# Patient Record
Sex: Male | Born: 1960 | Race: White | Hispanic: No | Marital: Married | Smoking: Never smoker
Health system: Southern US, Community
[De-identification: ages and names within clinical notes are randomized; demographics above are authoritative.]

## PROBLEM LIST (undated history)

## (undated) DIAGNOSIS — E785 Hyperlipidemia, unspecified: Secondary | ICD-10-CM

## (undated) DIAGNOSIS — J309 Allergic rhinitis, unspecified: Secondary | ICD-10-CM

## (undated) DIAGNOSIS — D72829 Elevated white blood cell count, unspecified: Secondary | ICD-10-CM

## (undated) DIAGNOSIS — N2889 Other specified disorders of kidney and ureter: Secondary | ICD-10-CM

## (undated) DIAGNOSIS — F419 Anxiety disorder, unspecified: Principal | ICD-10-CM

## (undated) DIAGNOSIS — F329 Major depressive disorder, single episode, unspecified: Principal | ICD-10-CM

## (undated) DIAGNOSIS — N182 Chronic kidney disease, stage 2 (mild): Secondary | ICD-10-CM

## (undated) DIAGNOSIS — I10 Essential (primary) hypertension: Secondary | ICD-10-CM

## (undated) HISTORY — DX: Elevated white blood cell count, unspecified: D72.829

## (undated) HISTORY — DX: Other specified disorders of kidney and ureter: N28.89

## (undated) HISTORY — DX: Allergic rhinitis, unspecified: J30.9

## (undated) HISTORY — DX: Hyperlipidemia, unspecified: E78.5

## (undated) HISTORY — DX: Chronic kidney disease, stage 2 (mild): N18.2

## (undated) HISTORY — DX: Essential (primary) hypertension: I10

## (undated) HISTORY — DX: Major depressive disorder, single episode, unspecified: F32.9

## (undated) HISTORY — DX: Anxiety disorder, unspecified: F41.9

## (undated) HISTORY — PX: FINGER SURGERY: SHX640

---
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
## 1999-07-07 ENCOUNTER — Inpatient Hospital Stay (HOSPITAL_COMMUNITY): Admission: AD | Admit: 1999-07-07 | Discharge: 1999-07-09 | Payer: Self-pay | Admitting: *Deleted

## 2001-02-22 ENCOUNTER — Inpatient Hospital Stay (HOSPITAL_COMMUNITY): Admission: EM | Admit: 2001-02-22 | Discharge: 2001-02-24 | Payer: Self-pay | Admitting: Psychiatry

## 2001-02-23 ENCOUNTER — Encounter (HOSPITAL_COMMUNITY): Payer: Self-pay | Admitting: Psychiatry

## 2006-07-25 ENCOUNTER — Ambulatory Visit: Payer: Self-pay | Admitting: Family Medicine

## 2006-07-27 ENCOUNTER — Ambulatory Visit: Payer: Self-pay | Admitting: Family Medicine

## 2006-07-27 LAB — CONVERTED CEMR LAB
ALT: 36 units/L (ref 0–40)
AST: 23 units/L (ref 0–37)
Albumin: 4.1 g/dL (ref 3.5–5.2)
Alkaline Phosphatase: 51 units/L (ref 39–117)
BUN: 18 mg/dL (ref 6–23)
Basophils Absolute: 0.1 10*3/uL (ref 0.0–0.1)
Basophils Relative: 0.7 % (ref 0.0–1.0)
CO2: 29 meq/L (ref 19–32)
Calcium: 9.4 mg/dL (ref 8.4–10.5)
Chloride: 103 meq/L (ref 96–112)
Chol/HDL Ratio, serum: 6.6
Cholesterol: 278 mg/dL (ref 0–200)
Creatinine, Ser: 1.4 mg/dL (ref 0.4–1.5)
Eosinophil percent: 3.3 % (ref 0.0–5.0)
GFR calc non Af Amer: 59 mL/min
Glomerular Filtration Rate, Af Am: 71 mL/min/{1.73_m2}
Glucose, Bld: 90 mg/dL (ref 70–99)
HCT: 44.7 % (ref 39.0–52.0)
HDL: 42.3 mg/dL (ref >39.0)
Hemoglobin: 15.1 g/dL (ref 13.0–17.0)
LDL DIRECT: 134.9 mg/dL
Lymphocytes Relative: 26 % (ref 12.0–46.0)
MCHC: 33.8 g/dL (ref 30.0–36.0)
MCV: 92.2 fL (ref 78.0–100.0)
Monocytes Absolute: 0.9 10*3/uL — ABNORMAL HIGH (ref 0.2–0.7)
Monocytes Relative: 7.9 % (ref 3.0–11.0)
Neutro Abs: 6.6 10*3/uL (ref 1.4–7.7)
Neutrophils Relative %: 62.1 % (ref 43.0–77.0)
PSA: 1.58 ng/mL (ref 0.10–4.00)
Platelets: 320 10*3/uL (ref 150–400)
Potassium: 3.6 meq/L (ref 3.5–5.1)
RBC: 4.85 M/uL (ref 4.22–5.81)
RDW: 11.9 % (ref 11.5–14.6)
Sodium: 140 meq/L (ref 135–145)
TSH: 2.69 microintl units/mL (ref 0.35–5.50)
Total Bilirubin: 1 mg/dL (ref 0.3–1.2)
Total Protein: 6.9 g/dL (ref 6.0–8.3)
Triglyceride fasting, serum: 350 mg/dL (ref 0–149)
VLDL: 70 mg/dL — ABNORMAL HIGH (ref 0–40)
WBC: 10.8 10*3/uL — ABNORMAL HIGH (ref 4.5–10.5)

## 2007-05-17 DIAGNOSIS — K219 Gastro-esophageal reflux disease without esophagitis: Secondary | ICD-10-CM | POA: Insufficient documentation

## 2009-02-19 ENCOUNTER — Encounter: Payer: Self-pay | Admitting: Family Medicine

## 2010-09-27 ENCOUNTER — Emergency Department (INDEPENDENT_AMBULATORY_CARE_PROVIDER_SITE_OTHER): Payer: No Typology Code available for payment source

## 2010-09-27 ENCOUNTER — Emergency Department (HOSPITAL_BASED_OUTPATIENT_CLINIC_OR_DEPARTMENT_OTHER)
Admission: EM | Admit: 2010-09-27 | Discharge: 2010-09-27 | Disposition: A | Discharge status: Home / Self Care | Payer: No Typology Code available for payment source | Attending: Emergency Medicine | Admitting: Emergency Medicine

## 2010-09-27 DIAGNOSIS — R55 Syncope and collapse: Secondary | ICD-10-CM | POA: Insufficient documentation

## 2010-09-27 DIAGNOSIS — F17200 Nicotine dependence, unspecified, uncomplicated: Secondary | ICD-10-CM

## 2010-09-27 DIAGNOSIS — I10 Essential (primary) hypertension: Secondary | ICD-10-CM

## 2010-09-27 LAB — POCT CARDIAC MARKERS
CKMB, poc: 1.1 ng/mL (ref 1.0–8.0)
Myoglobin, poc: 184 ng/mL (ref 12–200)
Troponin i, poc: <0.05 ng/mL (ref 0.00–0.09)

## 2010-09-27 LAB — DIFFERENTIAL
Basophils Absolute: 0.1 10*3/uL (ref 0.0–0.1)
Basophils Relative: 0 % (ref 0–1)
Eosinophils Absolute: 0.2 10*3/uL (ref 0.0–0.7)
Eosinophils Relative: 1 % (ref 0–5)
Lymphocytes Relative: 8 % — ABNORMAL LOW (ref 12–46)
Lymphs Abs: 1.9 10*3/uL (ref 0.7–4.0)
Monocytes Absolute: 1.1 10*3/uL — ABNORMAL HIGH (ref 0.1–1.0)
Monocytes Relative: 4 % (ref 3–12)
Neutro Abs: 20.8 10*3/uL — ABNORMAL HIGH (ref 1.7–7.7)
Neutrophils Relative %: 87 % — ABNORMAL HIGH (ref 43–77)

## 2010-09-27 LAB — CBC
HCT: 39.5 % (ref 39.0–52.0)
Hemoglobin: 14.3 g/dL (ref 13.0–17.0)
MCH: 32.6 pg (ref 26.0–34.0)
MCHC: 36.2 g/dL — ABNORMAL HIGH (ref 30.0–36.0)
MCV: 90.2 fL (ref 78.0–100.0)
Platelets: 308 10*3/uL (ref 150–400)
RBC: 4.38 MIL/uL (ref 4.22–5.81)
RDW: 12.6 % (ref 11.5–15.5)
WBC: 24 10*3/uL — ABNORMAL HIGH (ref 4.0–10.5)

## 2010-09-27 LAB — BASIC METABOLIC PANEL
CO2: 20 mEq/L (ref 19–32)
Calcium: 8.6 mg/dL (ref 8.4–10.5)
Creatinine, Ser: 1.3 mg/dL (ref 0.4–1.5)
GFR calc Af Amer: >60 mL/min (ref >60)
GFR calc non Af Amer: 59 mL/min — ABNORMAL LOW (ref >60)

## 2010-09-27 LAB — BASIC METABOLIC PANEL WITH GFR
BUN: 17 mg/dL (ref 6–23)
Chloride: 108 meq/L (ref 96–112)
Glucose, Bld: 90 mg/dL (ref 70–99)
Potassium: 4.1 meq/L (ref 3.5–5.1)
Sodium: 142 meq/L (ref 135–145)

## 2012-06-16 IMAGING — CT CT HEAD W/O CM
1 series · 16 of 30 positions shown, 20 images · non-contrast
Comparison: None.

CLINICAL DATA: Syncope.  Hypotension.

CT HEAD WITHOUT CONTRAST
TECHNIQUE: Contiguous axial images were obtained from the base of
the skull through the vertex without contrast.

[Series 2: head 4.8 h37s · axial · 0.43mm/px · z∈[-140,-7]mm · 16 of 32 slices shown, 20 images]
[im 2/32  brain]
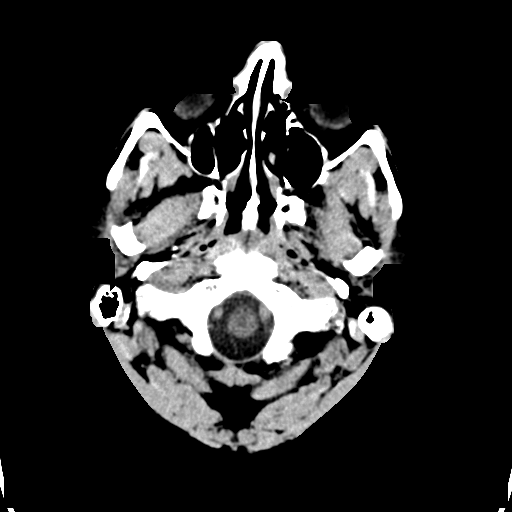
[im 2/32  bone]
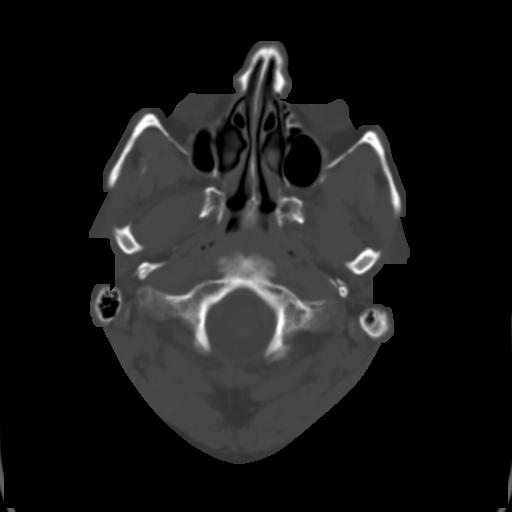
[im 4/32  brain]
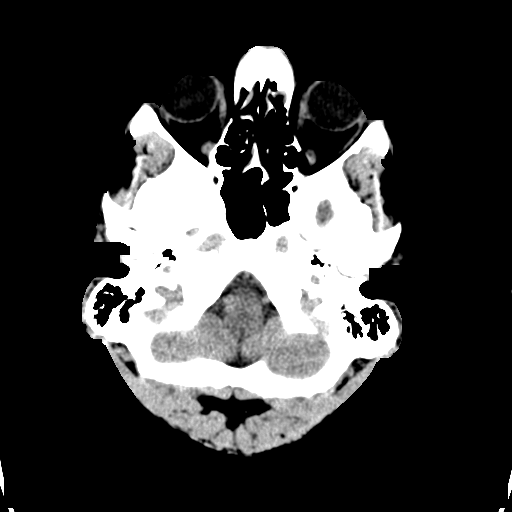
[im 6/32  brain]
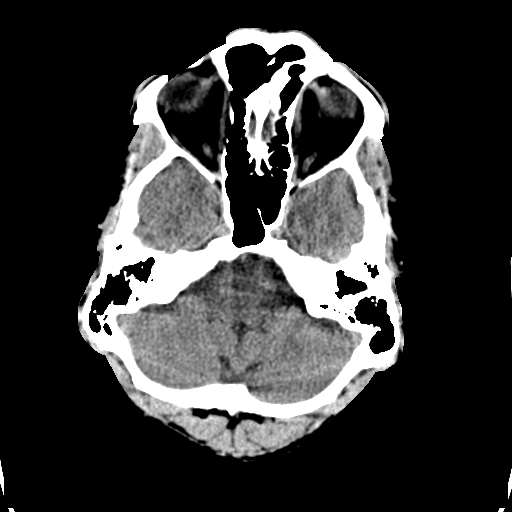
[im 8/32  brain]
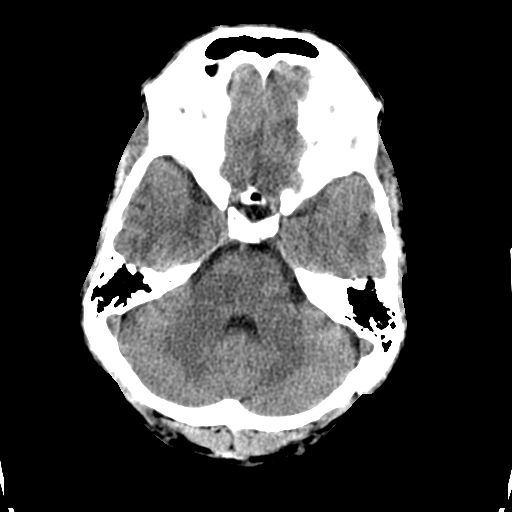
[im 9/32  brain]
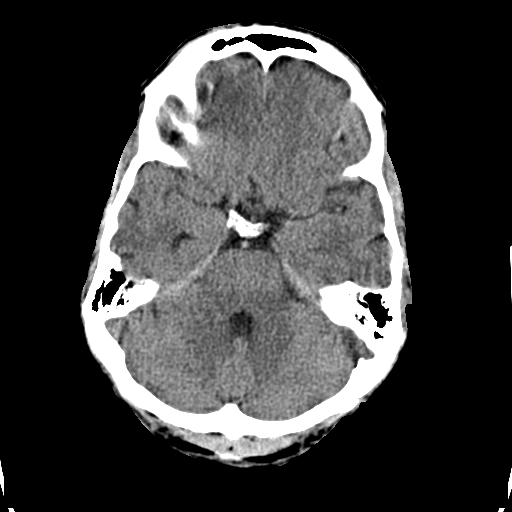
[im 9/32  bone]
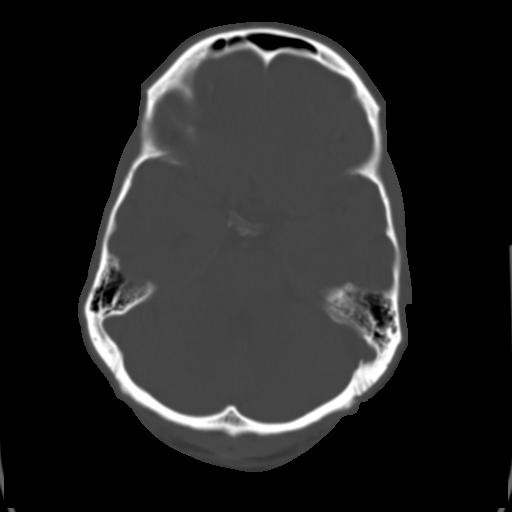
[im 11/32  brain]
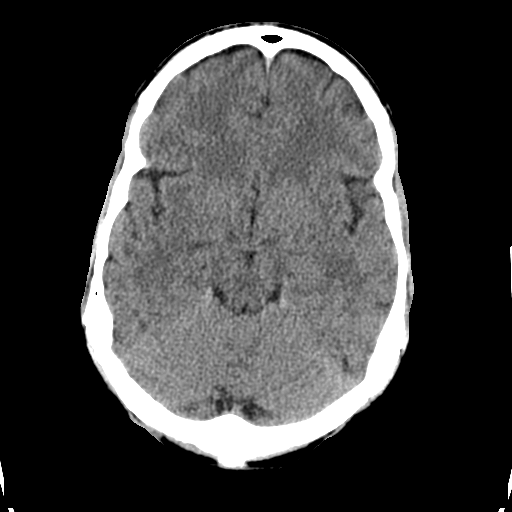
[im 13/32  brain]
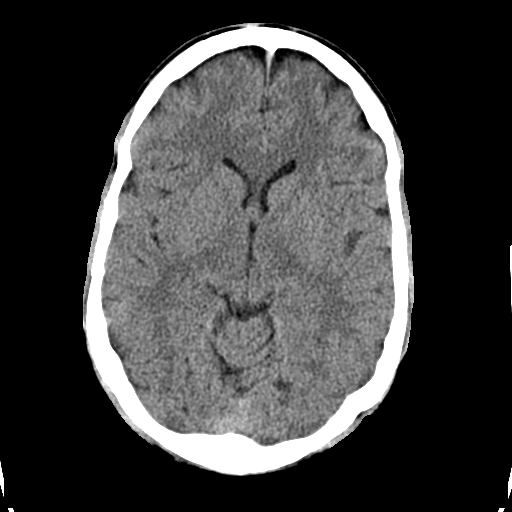
[im 15/32  brain]
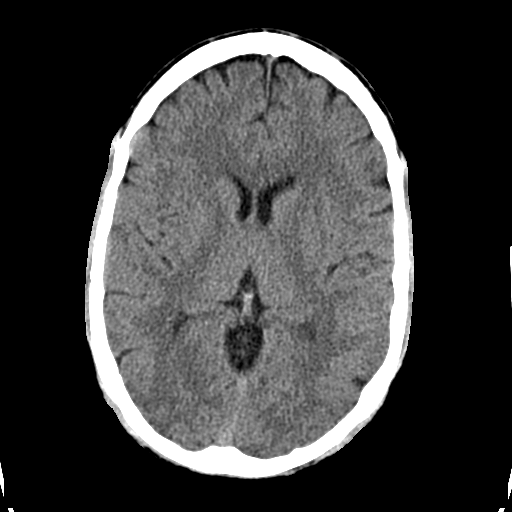
[im 17/32  brain]
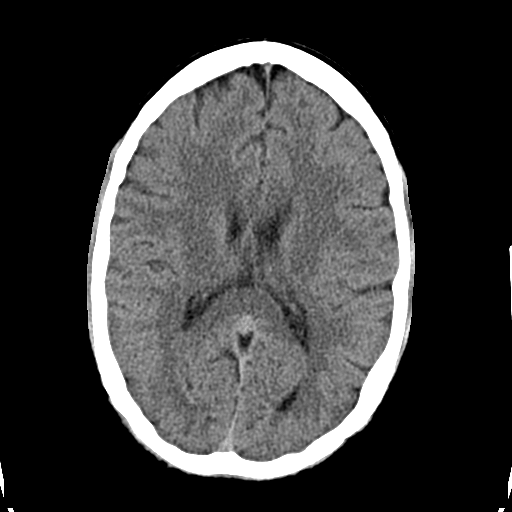
[im 17/32  bone]
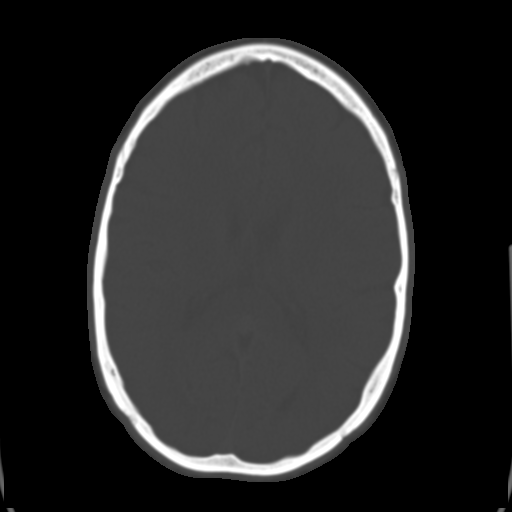
[im 19/32  brain]
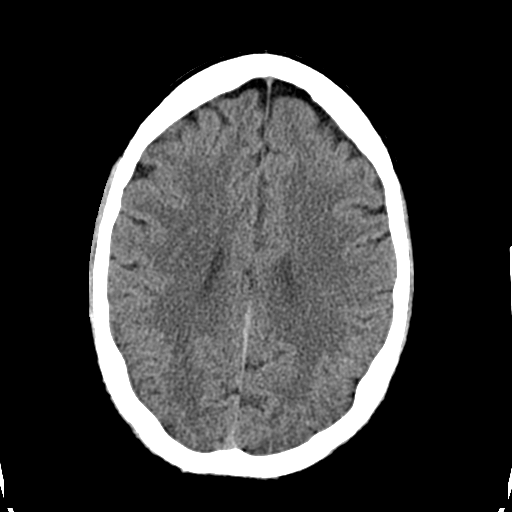
[im 21/32  brain]
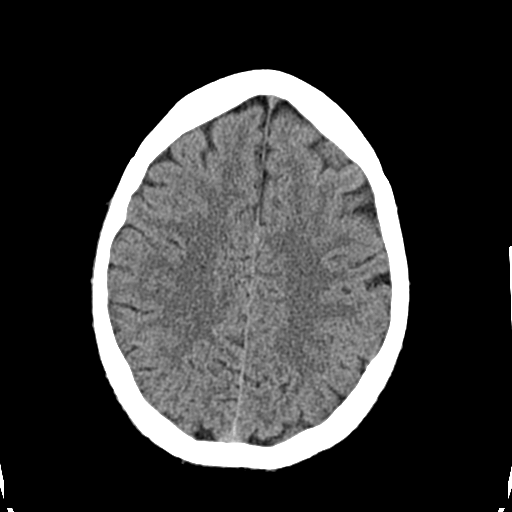
[im 23/32  brain]
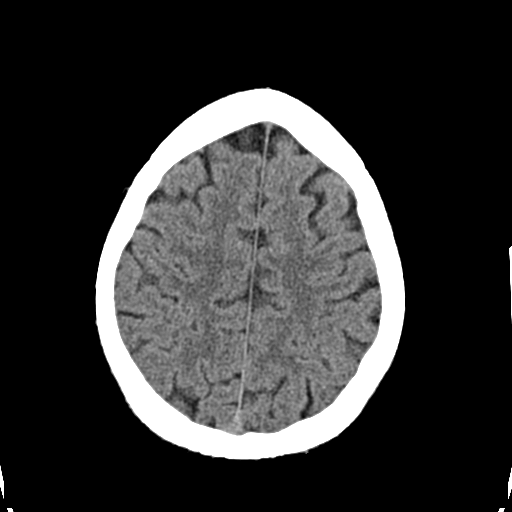
[im 24/32  brain]
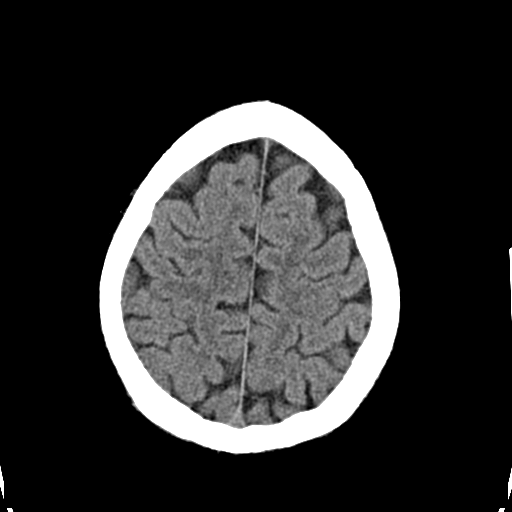
[im 24/32  bone]
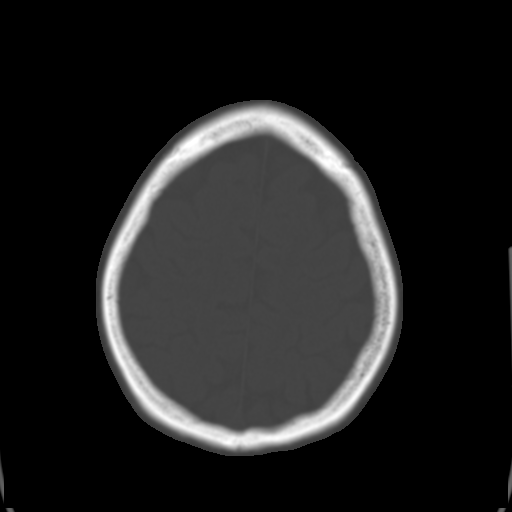
[im 26/32  brain]
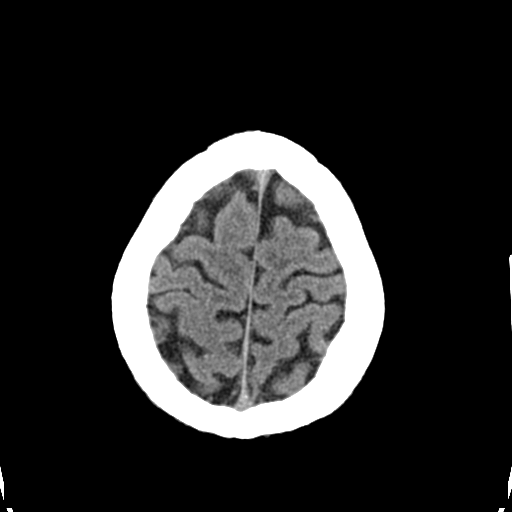
[im 28/32  brain]
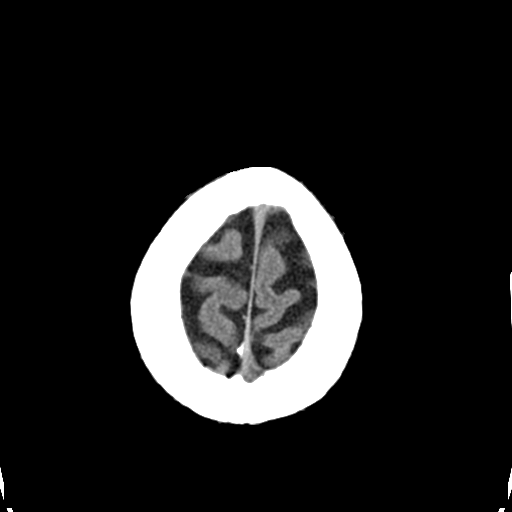
[im 30/32  brain]
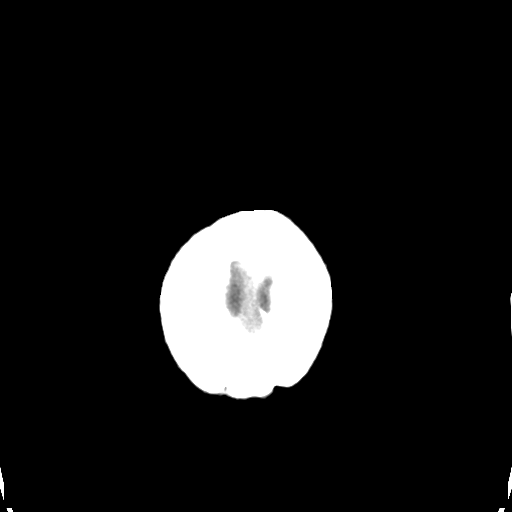

[16 of 30 positions shown; findings below may reference images not displayed]

FINDINGS: The brain stem, cerebellum, cerebral peduncles, thalami,
basal ganglia, basilar cisterns, and ventricular system appear
unremarkable.

No intracranial hemorrhage, mass lesion, or acute infarction is
identified.
IMPRESSION: No significant abnormality identified.

## 2014-04-08 ENCOUNTER — Encounter: Payer: Self-pay | Admitting: Emergency Medicine

## 2014-04-08 ENCOUNTER — Emergency Department
Admission: EM | Admit: 2014-04-08 | Discharge: 2014-04-08 | Disposition: A | Discharge status: Home / Self Care | Payer: Managed Care, Other (non HMO) | Source: Home / Self Care | Attending: Family Medicine | Admitting: Family Medicine

## 2014-04-08 ENCOUNTER — Telehealth: Payer: Self-pay | Admitting: *Deleted

## 2014-04-08 DIAGNOSIS — F411 Generalized anxiety disorder: Secondary | ICD-10-CM

## 2014-04-08 MED ORDER — DULOXETINE HCL 30 MG PO CPEP: 30.0000 mg | ORAL_CAPSULE | Freq: Every day | ORAL | Status: DC

## 2014-04-08 NOTE — ED Notes (Signed)
Patient's wife reports patient has had a lack of appetite and feelings of anxiety for several months.

## 2014-04-08 NOTE — Discharge Instructions (Signed)
Generalized Anxiety Disorder Generalized anxiety disorder (GAD) is a mental disorder. It interferes with life functions, including relationships, work, and school. GAD is different from normal anxiety, which everyone experiences at some point in their lives in response to specific life events and activities. Normal anxiety actually helps us prepare for and get through these life events and activities. Normal anxiety goes away after the event or activity is over.  GAD causes anxiety that is not necessarily related to specific events or activities. It also causes excess anxiety in proportion to specific events or activities. The anxiety associated with GAD is also difficult to control. GAD can vary from mild to severe. People with severe GAD can have intense waves of anxiety with physical symptoms (panic attacks).  SYMPTOMS The anxiety and worry associated with GAD are difficult to control. This anxiety and worry are related to many life events and activities and also occur more days than not for 6 months or longer. People with GAD also have three or more of the following symptoms (one or more in children):  Restlessness.   Fatigue.  Difficulty concentrating.   Irritability.  Muscle tension.  Difficulty sleeping or unsatisfying sleep. DIAGNOSIS GAD is diagnosed through an assessment by your health care provider. Your health care provider will ask you questions aboutyour mood,physical symptoms, and events in your life. Your health care provider may ask you about your medical history and use of alcohol or drugs, including prescription medicines. Your health care provider may also do a physical exam and blood tests. Certain medical conditions and the use of certain substances can cause symptoms similar to those associated with GAD. Your health care provider may refer you to a mental health specialist for further evaluation. TREATMENT The following therapies are usually used to treat GAD:    Medication. Antidepressant medication usually is prescribed for long-term daily control. Antianxiety medicines may be added in severe cases, especially when panic attacks occur.   Talk therapy (psychotherapy). Certain types of talk therapy can be helpful in treating GAD by providing support, education, and guidance. A form of talk therapy called cognitive behavioral therapy can teach you healthy ways to think about and react to daily life events and activities.  Stress managementtechniques. These include yoga, meditation, and exercise and can be very helpful when they are practiced regularly. A mental health specialist can help determine which treatment is best for you. Some people see improvement with one therapy. However, other people require a combination of therapies. Document Released: 12/04/2012 Document Revised: 12/24/2013 Document Reviewed: 12/04/2012 [employer] Patient Information 2015 [employer]. This information is not intended to replace advice given to you by your health care provider. Make sure you discuss any questions you have with your health care provider.  

## 2014-04-08 NOTE — ED Provider Notes (Addendum)
CSN: [id number]     Arrival date & time 04/08/14  1159 History   First MD Initiated Contact with Patient 04/08/14 1236     Chief Complaint  Patient presents with  . Anxiety      HPI Comments: Patient's wife initiates the interview by reporting that he has had a lack of appetite and feelings of anxiety for several months.  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 The patient states that he has always worried excessively, worse over the past year, and significantly increased over the past several months.  His thoughts are always racing, and he has difficulty concentrating on the tasks at hand.  His sleep is poor and he has early morning awakening.  He feels mildly depressed at times, but primarily is anxious and worries constantly.  He is obsessive in his daily activities.  No suicidal ideation.  No history of panic attacks.  He has not been prescribed medication for anxiety or depression in the past. He notes that his BP is normally about 120/70. Family history of similar anxiety in his brother, whose symptoms have been effectively controlled with Cymbalta.  Patient is a 53 y.o. male presenting with anxiety. The history is provided by the patient and the spouse.  Anxiety This is a chronic problem. Episode onset: 1 year ago. The problem occurs constantly. The problem has been gradually worsening. Pertinent negatives include no chest pain, no abdominal pain, no headaches and no shortness of breath. Associated symptoms comments: Decreased appetite for several months. Exacerbated by: job stresses. Nothing relieves the symptoms. He has tried nothing for the symptoms.    History reviewed. No pertinent past medical history. History reviewed. No pertinent past surgical history. History reviewed:  Anxiety disorder in brother. History  Substance Use Topics  . Smoking status: Never Smoker   . Smokeless tobacco: Never Used  . Alcohol Use: 2.4 oz/week    4 Cans of  beer per week    Review of Systems  Respiratory: Negative for shortness of breath.   Cardiovascular: Negative for chest pain.  Gastrointestinal: Negative for abdominal pain.  Neurological: Negative for headaches.  All other systems reviewed and are negative.   Allergies  Review of patient's allergies indicates no known allergies.  Home Medications   Prior to Admission medications   Medication Sig Start Date End Date Taking? Authorizing Provider  DULoxetine (CYMBALTA) 30 MG capsule Take 1 capsule (30 mg total) by mouth daily. 04/08/14   Lattie Haw, MD   BP 189/117  Pulse 93  Temp(Src) 98.6 F (37 C) (Oral)  Resp 18  Ht 5\' 10"  (1.778 m)  Wt 157 lb (71.215 kg)  BMI 22.53 kg/m2  SpO2 9% Physical Exam Nursing notes and Vital Signs reviewed. Appearance:  Patient appears healthy, stated age, and in no acute distress. Psychiatric:  Patient is alert and oriented with poor eye contact initially, improving later in the interview.  Thoughts are organized and appropriate.  Patient quiet initially, with wife responding primarily, but he becomes more animated as the interview progresses.  Affect flat; mood somewhat depressed.  No psychomotor retardation.  Memory intact.  Not suicidal  Eyes:  Pupils are equal, round, and reactive to light and accomodation.  Extraocular movement is intact.  Conjunctivae are not inflamed   Pharynx:  Normal Neck:  Supple.  No adenopathy or thyromegaly Lungs:  Clear to auscultation.  Breath sounds are equal.  Heart:  Regular rate and rhythm without murmurs, rubs,  or gallops.  Abdomen:  Nontender without masses or hepatosplenomegaly.  Bowel sounds are present.  No CVA or flank tenderness.  Extremities:  No edema.  Skin:  No rash present.   ED Course  Procedures  none      MDM   1. Generalized anxiety disorder    Office visit included 30 minutes face to face interview with patient and wife. Begin Cymbalta 30mg , once daily, RX #10, no  refill. Followup with Dr. Ivan AnchorsHommel in one week.    Lattie HawStephen A Beese, MD 04/13/14 1038  Lattie HawStephen A Beese, MD 04/13/14 [phone]

## 2014-04-15 ENCOUNTER — Encounter: Payer: Self-pay | Admitting: Family Medicine

## 2014-04-15 ENCOUNTER — Ambulatory Visit (INDEPENDENT_AMBULATORY_CARE_PROVIDER_SITE_OTHER): Payer: Managed Care, Other (non HMO) | Admitting: Family Medicine

## 2014-04-15 VITALS — BP 153/92 | HR 84 | Ht 70.0 in | Wt 159.0 lb

## 2014-04-15 DIAGNOSIS — F341 Dysthymic disorder: Secondary | ICD-10-CM

## 2014-04-15 DIAGNOSIS — F32A Depression, unspecified: Secondary | ICD-10-CM

## 2014-04-15 DIAGNOSIS — F419 Anxiety disorder, unspecified: Secondary | ICD-10-CM

## 2014-04-15 DIAGNOSIS — F329 Major depressive disorder, single episode, unspecified: Secondary | ICD-10-CM | POA: Insufficient documentation

## 2014-04-15 HISTORY — DX: Depression, unspecified: F32.A

## 2014-04-15 HISTORY — DX: Anxiety disorder, unspecified: F41.9

## 2014-04-15 MED ORDER — DULOXETINE HCL 60 MG PO CPEP: 60.0000 mg | ORAL_CAPSULE | Freq: Every day | ORAL | Status: DC

## 2014-04-15 NOTE — Progress Notes (Signed)
CC: Wayne Douglas is a 53 y.o. male is here for Establish Care   Subjective: HPI:  Very pleasant 53 year old here to establish care  Patient complains of anxiety and depression that has been present since spring of this year on a daily basis that has been worsening over the last month. Symptoms are described as nervousness, fixating on stress in his life, absence of interest in all hobbies (golfing), and difficulty falling asleep. Symptoms are moderate in severity and slightly improved since he started on Cymbalta 2 weeks ago. He denies any known side effects. He still believes there is room for improvement. Symptoms are present all hours of the day, worse when thinking about stress at work, other than the medication above nothing seems to help.  Denies paranoia, hallucinations, nor thoughts wanting to harm himself or others  Review of Systems - General ROS: negative for - chills, fever, night sweats, weight gain or weight loss Ophthalmic ROS: negative for - decreased vision ENT ROS: negative for - hearing change, nasal congestion, tinnitus or allergies Hematological and Lymphatic ROS: negative for - bleeding problems, bruising or swollen lymph nodes Breast ROS: negative Respiratory ROS: no cough, shortness of breath, or wheezing Cardiovascular ROS: no chest pain or dyspnea on exertion Gastrointestinal ROS: no abdominal pain, change in bowel habits, or black or bloody stools Genito-Urinary ROS: negative for - genital discharge, genital ulcers, incontinence or abnormal bleeding from genitals Musculoskeletal ROS: negative for - joint pain or muscle pain Neurological ROS: negative for - headaches or memory loss Dermatological ROS: negative for lumps, mole changes, rash and skin lesion changes   Past Medical History  Diagnosis Date  . Anxiety and depression 04/15/2014    History reviewed. No pertinent past surgical history. Family History  Problem Relation Age of Onset  . Melanoma  Mother   . Heart attack Father     History   Social History  . Marital Status: Single    Spouse Name: N/A    Number of Children: N/A  . Years of Education: N/A   Occupational History  . Not on file.   Social History Main Topics  . Smoking status: Never Smoker   . Smokeless tobacco: Never Used  . Alcohol Use: 2.4 oz/week    4 Cans of beer per week  . Drug Use: No  . Sexual Activity: Yes    Partners: Female   Other Topics Concern  . Not on file   Social History Narrative  . No narrative on file     Objective: BP 153/92  Pulse 84  Ht  (1.778 m)  Wt 159 lb (72.122 kg)  BMI 22.81 kg/m2  General: Alert and Oriented, No Acute Distress HEENT: Pupils equal, round, reactive to light. Conjunctivae clear.  External ears unremarkable, moist mucous membranes pharynx unremarkable Lungs: Clear to auscultation bilaterally, no wheezing/ronchi/rales.  Comfortable work of breathing. Good air movement. Cardiac: Regular rate and rhythm. Normal S1/S2.  No murmurs, rubs, nor gallops.   Extremities: No peripheral edema.  Strong peripheral pulses.  Mental Status: Mild depression and anxiety, nor agitation Skin: Warm and dry.  Assessment & Plan: Wayne Douglas was seen today for establish care.  Diagnoses and associated orders for this visit:  Anxiety and depression - DULoxetine (CYMBALTA) 60 MG capsule; Take 1 capsule (60 mg total) by mouth daily.    Anxiety and depression: Uncontrolled increasing Cymbalta to , followup in 1-2 months.  Return in about 2 months (around 06/15/2014).

## 2014-06-18 ENCOUNTER — Other Ambulatory Visit: Payer: Self-pay | Admitting: *Deleted

## 2014-06-18 DIAGNOSIS — F32A Depression, unspecified: Secondary | ICD-10-CM

## 2014-06-18 DIAGNOSIS — F329 Major depressive disorder, single episode, unspecified: Secondary | ICD-10-CM

## 2014-06-18 DIAGNOSIS — F419 Anxiety disorder, unspecified: Principal | ICD-10-CM

## 2014-06-18 MED ORDER — DULOXETINE HCL 60 MG PO CPEP
60.0000 mg | ORAL_CAPSULE | Freq: Every day | ORAL | Status: DC
Start: 2014-06-18 — End: 2014-07-18

## 2014-07-18 ENCOUNTER — Other Ambulatory Visit: Payer: Self-pay | Admitting: Family Medicine

## 2014-08-22 ENCOUNTER — Other Ambulatory Visit: Payer: Self-pay | Admitting: Family Medicine

## 2015-06-10 ENCOUNTER — Ambulatory Visit (INDEPENDENT_AMBULATORY_CARE_PROVIDER_SITE_OTHER): Payer: BLUE CROSS/BLUE SHIELD | Admitting: Family Medicine

## 2015-06-10 ENCOUNTER — Encounter: Payer: Self-pay | Admitting: Family Medicine

## 2015-06-10 VITALS — BP 168/98 | HR 85 | Wt 158.0 lb

## 2015-06-10 DIAGNOSIS — F418 Other specified anxiety disorders: Secondary | ICD-10-CM

## 2015-06-10 DIAGNOSIS — F32A Depression, unspecified: Secondary | ICD-10-CM

## 2015-06-10 DIAGNOSIS — Z23 Encounter for immunization: Secondary | ICD-10-CM | POA: Diagnosis not present

## 2015-06-10 DIAGNOSIS — F419 Anxiety disorder, unspecified: Secondary | ICD-10-CM

## 2015-06-10 DIAGNOSIS — I10 Essential (primary) hypertension: Secondary | ICD-10-CM | POA: Diagnosis not present

## 2015-06-10 DIAGNOSIS — F329 Major depressive disorder, single episode, unspecified: Secondary | ICD-10-CM

## 2015-06-10 MED ORDER — DULOXETINE HCL 60 MG PO CPEP: 60.0000 mg | ORAL_CAPSULE | Freq: Every day | ORAL | Status: DC

## 2015-06-10 NOTE — Patient Instructions (Signed)
DASH Eating Plan  DASH stands for "Dietary Approaches to Stop Hypertension." The DASH eating plan is a healthy eating plan that has been shown to reduce high blood pressure (hypertension). Additional health benefits may include reducing the risk of type 2 diabetes mellitus, heart disease, and stroke. The DASH eating plan may also help with weight loss.  WHAT DO I NEED TO KNOW ABOUT THE DASH EATING PLAN?  For the DASH eating plan, you will follow these general guidelines:  · Choose foods with a percent daily value for sodium of less than 5% (as listed on the food label).  · Use salt-free seasonings or herbs instead of table salt or sea salt.  · Check with your health care provider or pharmacist before using salt substitutes.  · Eat lower-sodium products, often labeled as "lower sodium" or "no salt added."  · Eat fresh foods.  · Eat more vegetables, fruits, and low-fat dairy products.  · Choose whole grains. Look for the word "whole" as the first word in the ingredient list.  · Choose fish and skinless chicken or turkey more often than red meat. Limit fish, poultry, and meat to 6 oz (170 g) each day.  · Limit sweets, desserts, sugars, and sugary drinks.  · Choose heart-healthy fats.  · Limit cheese to 1 oz (28 g) per day.  · Eat more home-cooked food and less restaurant, buffet, and fast food.  · Limit fried foods.  · Cook foods using methods other than frying.  · Limit canned vegetables. If you do use them, rinse them well to decrease the sodium.  · When eating at a restaurant, ask that your food be prepared with less salt, or no salt if possible.  WHAT FOODS CAN I EAT?  Seek help from a dietitian for individual calorie needs.  Grains  Whole grain or whole wheat bread. Brown rice. Whole grain or whole wheat pasta. Quinoa, bulgur, and whole grain cereals. Low-sodium cereals. Corn or whole wheat flour tortillas. Whole grain cornbread. Whole grain crackers. Low-sodium crackers.  Vegetables  Fresh or frozen vegetables  (raw, steamed, roasted, or grilled). Low-sodium or reduced-sodium tomato and vegetable juices. Low-sodium or reduced-sodium tomato sauce and paste. Low-sodium or reduced-sodium canned vegetables.   Fruits  All fresh, canned (in natural juice), or frozen fruits.  Meat and Other Protein Products  Ground beef (85% or leaner), grass-fed beef, or beef trimmed of fat. Skinless chicken or turkey. Ground chicken or turkey. Pork trimmed of fat. All fish and seafood. Eggs. Dried beans, peas, or lentils. Unsalted nuts and seeds. Unsalted canned beans.  Dairy  Low-fat dairy products, such as skim or 1% milk, 2% or reduced-fat cheeses, low-fat ricotta or cottage cheese, or plain low-fat yogurt. Low-sodium or reduced-sodium cheeses.  Fats and Oils  Tub margarines without trans fats. Light or reduced-fat mayonnaise and salad dressings (reduced sodium). Avocado. Safflower, olive, or canola oils. Natural peanut or almond butter.  Other  Unsalted popcorn and pretzels.  The items listed above may not be a complete list of recommended foods or beverages. Contact your dietitian for more options.  WHAT FOODS ARE NOT RECOMMENDED?  Grains  White bread. White pasta. White rice. Refined cornbread. Bagels and croissants. Crackers that contain trans fat.  Vegetables  Creamed or fried vegetables. Vegetables in a cheese sauce. Regular canned vegetables. Regular canned tomato sauce and paste. Regular tomato and vegetable juices.  Fruits  Dried fruits. Canned fruit in light or heavy syrup. Fruit juice.  Meat and Other Protein   Products  Fatty cuts of meat. Ribs, chicken wings, bacon, sausage, bologna, salami, chitterlings, fatback, hot dogs, bratwurst, and packaged luncheon meats. Salted nuts and seeds. Canned beans with salt.  Dairy  Whole or 2% milk, cream, half-and-half, and cream cheese. Whole-fat or sweetened yogurt. Full-fat cheeses or blue cheese. Nondairy creamers and whipped toppings. Processed cheese, cheese spreads, or cheese  curds.  Condiments  Onion and garlic salt, seasoned salt, table salt, and sea salt. Canned and packaged gravies. Worcestershire sauce. Tartar sauce. Barbecue sauce. Teriyaki sauce. Soy sauce, including reduced sodium. Steak sauce. Fish sauce. Oyster sauce. Cocktail sauce. Horseradish. Ketchup and mustard. Meat flavorings and tenderizers. Bouillon cubes. Hot sauce. Tabasco sauce. Marinades. Taco seasonings. Relishes.  Fats and Oils  Butter, stick margarine, lard, shortening, ghee, and bacon fat. Coconut, palm kernel, or palm oils. Regular salad dressings.  Other  Pickles and olives. Salted popcorn and pretzels.  The items listed above may not be a complete list of foods and beverages to avoid. Contact your dietitian for more information.  WHERE CAN I FIND MORE INFORMATION?  National Heart, [employer], and [employer] Institute: www.nhlbi.nih.gov/health/health-topics/topics/dash/     This information is not intended to replace advice given to you by your health care provider. Make sure you discuss any questions you have with your health care provider.     Document Released: 07/29/2011 Document Revised: 08/30/2014 Document Reviewed: 06/13/2013  Elsevier Interactive Patient Education ©2016 [employer]

## 2015-06-10 NOTE — Progress Notes (Signed)
CC: Wayne Douglas is a 54 y.o. male is here for Medication Refill   Subjective: HPI:  Follow-up anxiety: Over the past 2 months he has had worsening irritability, nervousness, anxiety and it will be sleeping. He tells me that regardless of what conversation is being played out in front of him he will find something to start worrying about. He reports worrying about everything and anything. Symptoms are worsened when taking about finances, his job, and other mundane things in his life. Nothing seems to make symptoms better other than when he was taking Cymbalta in the past. He denies any depression but does admit to decreased interest in hobbies. His wife has noticed that he is just constantly worrying about something. He denies paranoia hallucinations or any other mental disturbance. When symptoms are back he will get a tremor in both hands and feels like crying. No thoughts for no harm self or others.  No outside blood pressures report. No chest pain shortness of breath or orthopnea   Review Of Systems Outlined In HPI  Past Medical History  Diagnosis Date  . Anxiety and depression 04/15/2014    No past surgical history on file. Family History  Problem Relation Age of Onset  . Melanoma Mother   . Heart attack Father     Social History   Social History  . Marital Status: Married    Spouse Name: N/A  . Number of Children: N/A  . Years of Education: N/A   Occupational History  . Not on file.   Social History Main Topics  . Smoking status: Never Smoker   . Smokeless tobacco: Never Used  . Alcohol Use: 2.4 oz/week    4 Cans of beer per week  . Drug Use: No  . Sexual Activity:    Partners: Female   Other Topics Concern  . Not on file   Social History Narrative  . No narrative on file     Objective: BP 168/98 mmHg  Pulse 85  Wt 158 lb (71.668 kg)  Vital signs reviewed. General: Alert and Oriented, No Acute Distress HEENT: Pupils equal, round, reactive to light.  Conjunctivae clear.  External ears unremarkable.  Moist mucous membranes. Lungs: Clear and comfortable work of breathing, speaking in full sentences without accessory muscle use. Cardiac: Regular rate and rhythm.  Neuro: CN II-XII grossly intact, gait normal. Extremities: No peripheral edema.  Strong peripheral pulses.  Mental Status: No depression, , nor agitation. Mild to moderate anxiety Logical though process. Skin: Warm and dry.  Assessment & Plan: Wayne Douglas was seen today for medication refill.  Diagnoses and all orders for this visit:  Needs flu shot -     Flu Vaccine QUAD 36+ mos PF IM (Fluarix & Fluzone Quad PF)  Anxiety and depression -     DULoxetine (CYMBALTA) 60 MG capsule; Take 1 capsule (60 mg total) by mouth daily.  Essential hypertension, benign   anxiety: Uncontrolled chronic condition restart Cymbalta, if no better after 2 weeks will increase to twice a day dosing and then if no better after an additional week will consider adding a second agent.  Essential hypertension: Discussed DASH diet and sodium reduction. I like him to come back in about a month to recheck blood pressure especially if anxiety is controlled.     Return in about 4 weeks (around 07/08/2015).

## 2015-06-26 ENCOUNTER — Telehealth: Payer: Self-pay

## 2015-06-26 DIAGNOSIS — F329 Major depressive disorder, single episode, unspecified: Secondary | ICD-10-CM

## 2015-06-26 DIAGNOSIS — F419 Anxiety disorder, unspecified: Principal | ICD-10-CM

## 2015-06-26 NOTE — Telephone Encounter (Signed)
Pt reports that some days he feels like the medication (Cymbalta) is working and other days he feelings like its not taking affect.

## 2015-06-27 MED ORDER — DULOXETINE HCL 60 MG PO CPEP: 60.0000 mg | ORAL_CAPSULE | Freq: Two times a day (BID) | ORAL | Status: DC

## 2015-06-27 NOTE — Telephone Encounter (Signed)
Pt advised.

## 2015-06-27 NOTE — Telephone Encounter (Signed)
Rx changed to 60mg  twice a day, sent to [facility] [place], if this is ineffective after two weeks f/u to discuss additional medication.

## 2015-11-12 ENCOUNTER — Encounter: Payer: Self-pay | Admitting: Family Medicine

## 2015-11-12 ENCOUNTER — Ambulatory Visit (INDEPENDENT_AMBULATORY_CARE_PROVIDER_SITE_OTHER): Payer: BLUE CROSS/BLUE SHIELD | Admitting: Family Medicine

## 2015-11-12 VITALS — BP 154/110 | HR 109 | Wt 155.0 lb

## 2015-11-12 DIAGNOSIS — F329 Major depressive disorder, single episode, unspecified: Secondary | ICD-10-CM

## 2015-11-12 DIAGNOSIS — F32A Depression, unspecified: Secondary | ICD-10-CM

## 2015-11-12 DIAGNOSIS — F419 Anxiety disorder, unspecified: Principal | ICD-10-CM

## 2015-11-12 DIAGNOSIS — F418 Other specified anxiety disorders: Secondary | ICD-10-CM | POA: Diagnosis not present

## 2015-11-12 MED ORDER — ARIPIPRAZOLE 5 MG PO TABS: 5.0000 mg | ORAL_TABLET | Freq: Every day | ORAL | Status: DC

## 2015-11-12 NOTE — Progress Notes (Signed)
CC: Wayne Douglas is a 55 y.o. male is here for Anxiety; Depression; and Hypertension   Subjective: HPI:  Follow-up anxiety and depression: He tells me that since taking 60 mg of Cymbalta twice a day he's no longer experiences any anxiety. He tells me his biggest problem now is a "I don't care, I dont' give a shit" attitude towards his day and is life. He denies thoughts of wanting to harm himself or others. He tells me he's lost interest in sports which is extremely odd for him, [facility] which was also his birthday for the first time he can remember. He tells me he doesn't have any interest to go out and socialize with others and every little thing in his life seems to know him. Symptoms are moderate to severe in severity and been slowly worsening over the past few months. Denies any other mental disturbance. Was once on Wellbutrin but didn't seem to help with any of his mental issues.   Review Of Systems Outlined In HPI  Past Medical History  Diagnosis Date  . Anxiety and depression 04/15/2014    No past surgical history on file. Family History  Problem Relation Age of Onset  . Melanoma Mother   . Heart attack Father     Social History   Social History  . Marital Status: Married    Spouse Name: N/A  . Number of Children: N/A  . Years of Education: N/A   Occupational History  . Not on file.   Social History Main Topics  . Smoking status: Never Smoker   . Smokeless tobacco: Never Used  . Alcohol Use: 2.4 oz/week    4 Cans of beer per week  . Drug Use: No  . Sexual Activity:    Partners: Female   Other Topics Concern  . Not on file   Social History Narrative     Objective: BP 154/110 mmHg  Pulse 109  Wt 155 lb (70.308 kg)  Vital signs reviewed. General: Alert and Oriented, No Acute Distress HEENT: Pupils equal, round, reactive to light. Conjunctivae clear.  External ears unremarkable.  Moist mucous membranes. Lungs: Clear and  comfortable work of breathing, speaking in full sentences without accessory muscle use. Cardiac: Regular rate and rhythm.  Neuro: CN II-XII grossly intact, gait normal. Extremities: No peripheral edema.  Strong peripheral pulses.  Mental Status: No depression, anxiety, nor agitation. Logical though process. Moderately frustrated Skin: Warm and dry.  Assessment & Plan: Wayne Douglas was seen today for anxiety, depression and hypertension.  Diagnoses and all orders for this visit:  Anxiety and depression -     ARIPiprazole (ABILIFY) 5 MG tablet; Take 1 tablet (5 mg total) by mouth daily.   Anxiety is controlled however depression seems to be worsening therefore continue Cymbalta and adding Abilify. I let him know that we'll try calling him next week to see how is doing but to call sooner if anything starts to worsen.  25 minutes spent face-to-face during visit today of which at least 50% was counseling or coordinating care regarding: 1. Anxiety and depression      No Follow-up on file.

## 2015-11-18 ENCOUNTER — Telehealth: Payer: Self-pay

## 2015-11-18 MED ORDER — VILAZODONE HCL 10 & 20 MG PO KIT: 1.0000 | PACK | Freq: Every day | ORAL | Status: DC

## 2015-11-18 NOTE — Telephone Encounter (Signed)
Ok then stop abilify and switch to viibryd while continuing to take cymbalta.  I [facility] [place] and would recommend that he stop by here to pick up a savings voucher before going to the pharmacy to pick up the medication.

## 2015-11-18 NOTE — Telephone Encounter (Signed)
Pt feels that the Abilify is not working for him.  Any other suggestions?

## 2015-11-18 NOTE — Telephone Encounter (Signed)
instruction left on vm pt advised to call with any questions.

## 2016-01-17 ENCOUNTER — Other Ambulatory Visit: Payer: Self-pay | Admitting: Family Medicine

## 2016-02-21 ENCOUNTER — Other Ambulatory Visit: Payer: Self-pay | Admitting: Family Medicine

## 2016-05-23 ENCOUNTER — Other Ambulatory Visit: Payer: Self-pay | Admitting: Family Medicine

## 2016-06-21 ENCOUNTER — Other Ambulatory Visit: Payer: Self-pay | Admitting: Family Medicine

## 2016-08-10 ENCOUNTER — Other Ambulatory Visit: Payer: Self-pay

## 2016-08-10 MED ORDER — DULOXETINE HCL 60 MG PO CPEP: 60.0000 mg | ORAL_CAPSULE | Freq: Two times a day (BID) | ORAL | 0 refills | Status: DC

## 2016-08-18 ENCOUNTER — Telehealth: Payer: Self-pay | Admitting: Osteopathic Medicine

## 2016-08-18 NOTE — Telephone Encounter (Signed)
Called patient and left a message on vm for patient to call and schedule appt with another pcp since Dr.Hommel is no longer here

## 2016-09-18 ENCOUNTER — Other Ambulatory Visit: Payer: Self-pay | Admitting: Physician Assistant

## 2016-10-09 ENCOUNTER — Other Ambulatory Visit: Payer: Self-pay | Admitting: Physician Assistant

## 2016-10-21 DIAGNOSIS — D72829 Elevated white blood cell count, unspecified: Secondary | ICD-10-CM

## 2016-10-21 HISTORY — DX: Elevated white blood cell count, unspecified: D72.829

## 2016-11-03 ENCOUNTER — Encounter: Payer: Self-pay | Admitting: Family Medicine

## 2016-11-03 NOTE — Progress Notes (Signed)
Office Note 11/04/2016  CC:  Chief Complaint  Patient presents with  . Establish Care  . Anxiety  . Depression   HPI:  Wayne Douglas is a 56 y.o. male who is here to establish care and discuss anxiety and depression. Patient's most recent primary MD: [facility]. Old records in [facility] EMR were reviewed prior to or during today's visit.  No home bp monitoring.  Has never been on meds for this/never offered.   No HAs, dizziness, vision problems, CP, or SOB.  Anxiety/dep: lots of stress/anxiety/worry surrounding business and financial problems, feels overwhelmed when he has a pile of things to do.  Obsesses over problems, ruins quality of life.  Duloxetine has helped for his chronic anxiety pretty well. Abilify was added for depression but he says this did not help.  Has not been on this med for months. Says cymbalta gives him ED. Depression symptoms: isolation, sadness, anhedonia, irritable, no motivation.  No SI or HI.   Sleep is OK.  Appetite is poor.  He does not exercise.    Past Medical History:  Diagnosis Date  . Allergic rhinitis   . Anxiety and depression 04/15/2014   [facility] admissions in 2000 and 2002 per review of EMR records (details not available).  . Chronic renal insufficiency, stage 2 (mild)    GFR 60 ml/min  . History of chicken pox   . Hypertension     Past Surgical History:  Procedure Laterality Date  . APPENDECTOMY  1974  . FINGER SURGERY  remote past   tendon repair right index     Family History  Problem Relation Age of Onset  . Melanoma Mother   . Heart attack Father   . Heart disease Father   . Diabetes Son     Social History   Social History  . Marital status: Married    Spouse name: N/A  . Number of children: N/A  . Years of education: N/A   Occupational History  . Not on file.   Social History Main Topics  . Smoking status: Never Smoker  . Smokeless tobacco: Never Used  . Alcohol use 2.4 oz/week    4 Cans of  beer per week  . Drug use: No  . Sexual activity: Yes    Partners: Female   Other Topics Concern  . Not on file   Social History Narrative   Married, 4 children.   Educ: college.   Occup: Games developer.   Alc: 4-5 beers per week.   No tob.    Outpatient Encounter Prescriptions as of 11/04/2016  Medication Sig  . DULoxetine (CYMBALTA) 60 MG capsule Take 1 capsule (60 mg total) by mouth 2 (two) times daily. MUST make appointment for future refills.  . ARIPiprazole (ABILIFY) 5 MG tablet Take 1 tablet (5 mg total) by mouth daily. *LAST REFILL. PLEASE CALL OFFICE TO ESTABLISH CARE WITH PROVIDER.* (Patient not taking: Reported on 11/04/2016)  . irbesartan-hydrochlorothiazide (AVALIDE) 150-12.5 MG tablet Take 1 tablet by mouth daily.  Marland Kitchen lithium carbonate (LITHOBID) 300 MG CR tablet Take 1 tablet (300 mg total) by mouth 2 (two) times daily.  . [DISCONTINUED] Vilazodone HCl (VIIBRYD STARTER PACK) 10 & 20 MG KIT Take 1 tablet by mouth daily. (Patient not taking: Reported on 11/04/2016)   No facility-administered encounter medications on file as of 11/04/2016.     No Known Allergies  ROS Review of Systems  Constitutional: Negative for appetite change, chills, fatigue and fever.  HENT: Negative for congestion,  dental problem, ear pain and sore throat.   Eyes: Negative for discharge, redness and visual disturbance.  Respiratory: Negative for cough, chest tightness, shortness of breath and wheezing.   Cardiovascular: Negative for chest pain, palpitations and leg swelling.  Gastrointestinal: Negative for abdominal pain, blood in stool, diarrhea, nausea and vomiting.  Genitourinary: Negative for difficulty urinating, dysuria, flank pain, frequency, hematuria and urgency.       Erectile dysfunction  Musculoskeletal: Negative for arthralgias, back pain, joint swelling, myalgias and neck stiffness.  Skin: Negative for pallor and rash.  Neurological: Negative for dizziness, speech difficulty, weakness  and headaches.  Hematological: Negative for adenopathy. Does not bruise/bleed easily.  Psychiatric/Behavioral: Positive for dysphoric mood. Negative for confusion and sleep disturbance. The patient is nervous/anxious.     PE; Blood pressure (!) 170/104, pulse 77, temperature 98.8 F (37.1 C), temperature source Oral, resp. rate 16, height '5\' 9"'$  (1.753 m), weight 159 lb 8 oz (72.3 kg), SpO2 98 %. Repeat bp 170/104 Gen: Alert, well appearing.  Patient is oriented to person, place, time, and situation. AFFECT: pleasant, lucid thought and speech. ENT: Ears: EACs clear, normal epithelium.  TMs with good light reflex and landmarks bilaterally.  Eyes: no injection, icteris, swelling, or exudate.  EOMI, PERRLA. Nose: no drainage or turbinate edema/swelling.  No injection or focal lesion.  Mouth: lips without lesion/swelling.  Oral mucosa pink and moist.  Dentition intact and without obvious caries or gingival swelling.  Oropharynx without erythema, exudate, or swelling.  CV: RRR, no m/r/g.   LUNGS: CTA bilat, nonlabored resps, good aeration in all lung fields. EXT: no clubbing, cyanosis, or edema.   Pertinent labs:  None today  ASSESSMENT AND PLAN:   New pt; no old records to obtain.  1) HTN; needs to start med treatment. Irbesartan-hctz 150/12.5 eRx'd today. Discussed home bp monitoring, gave education handout about this.  2) Anxiety and depression: anxiety ok on duloxetine '60mg'$  bid--continue this. Start lithium ER 300 mg bid for depression.  Therapeutic expectations and side effect profile of medication discussed today.  Patient's questions answered. Will check trough lithium level at next f/u for CPE in 2 wks. I did do fasting HP labs + PSA today b/c pt was fasting.  An After Visit Summary was printed and given to the patient.  Return in about 2 weeks (around 11/18/2016) for CPE (fasting not necessary)--morning appointment please.  Signed:  Crissie Sickles, MD           11/04/2016

## 2016-11-04 ENCOUNTER — Ambulatory Visit (INDEPENDENT_AMBULATORY_CARE_PROVIDER_SITE_OTHER): Payer: BLUE CROSS/BLUE SHIELD | Admitting: Family Medicine

## 2016-11-04 ENCOUNTER — Encounter: Payer: Self-pay | Admitting: Family Medicine

## 2016-11-04 VITALS — BP 170/104 | HR 77 | Temp 98.8°F | Resp 16 | Ht 69.0 in | Wt 159.5 lb

## 2016-11-04 DIAGNOSIS — Z0000 Encounter for general adult medical examination without abnormal findings: Secondary | ICD-10-CM | POA: Diagnosis not present

## 2016-11-04 DIAGNOSIS — Z125 Encounter for screening for malignant neoplasm of prostate: Secondary | ICD-10-CM | POA: Diagnosis not present

## 2016-11-04 LAB — COMPREHENSIVE METABOLIC PANEL
ALK PHOS: 56 U/L (ref 39–117)
ALT: 15 U/L (ref 0–53)
AST: 17 U/L (ref 0–37)
Albumin: 4.5 g/dL (ref 3.5–5.2)
BUN: 17 mg/dL (ref 6–23)
CO2: 29 mEq/L (ref 19–32)
Calcium: 10 mg/dL (ref 8.4–10.5)
Chloride: 103 mEq/L (ref 96–112)
Creatinine, Ser: 1.06 mg/dL (ref 0.40–1.50)
GFR: 77.38 mL/min (ref >60.00)
GLUCOSE: 97 mg/dL (ref 70–99)
POTASSIUM: 5 meq/L (ref 3.5–5.1)
SODIUM: 139 meq/L (ref 135–145)
TOTAL PROTEIN: 7.1 g/dL (ref 6.0–8.3)
Total Bilirubin: 0.5 mg/dL (ref 0.2–1.2)

## 2016-11-04 LAB — CBC WITH DIFFERENTIAL/PLATELET
BASOS PCT: 1.6 % (ref 0.0–3.0)
Basophils Absolute: 0.2 10*3/uL — ABNORMAL HIGH (ref 0.0–0.1)
EOS PCT: 3.9 % (ref 0.0–5.0)
Eosinophils Absolute: 0.6 10*3/uL (ref 0.0–0.7)
HCT: 48.8 % (ref 39.0–52.0)
Hemoglobin: 16.3 g/dL (ref 13.0–17.0)
LYMPHS ABS: 2.3 10*3/uL (ref 0.7–4.0)
Lymphocytes Relative: 15.8 % (ref 12.0–46.0)
MCHC: 33.5 g/dL (ref 30.0–36.0)
MCV: 96.6 fl (ref 78.0–100.0)
MONO ABS: 1.1 10*3/uL — AB (ref 0.1–1.0)
Monocytes Relative: 7.7 % (ref 3.0–12.0)
NEUTROS PCT: 71 % (ref 43.0–77.0)
Neutro Abs: 10.5 10*3/uL — ABNORMAL HIGH (ref 1.4–7.7)
PLATELETS: 376 10*3/uL (ref 150.0–400.0)
RBC: 5.05 Mil/uL (ref 4.22–5.81)
RDW: 14 % (ref 11.5–15.5)
WBC: 14.7 10*3/uL — ABNORMAL HIGH (ref 4.0–10.5)

## 2016-11-04 LAB — LIPID PANEL
Cholesterol: 245 mg/dL — ABNORMAL HIGH (ref 0–200)
HDL: 53.3 mg/dL (ref >39.00)
LDL Cholesterol: 164 mg/dL — ABNORMAL HIGH (ref 0–99)
NONHDL: 191.47
Total CHOL/HDL Ratio: 5
Triglycerides: 137 mg/dL (ref 0.0–149.0)
VLDL: 27.4 mg/dL (ref 0.0–40.0)

## 2016-11-04 LAB — TSH: TSH: 1.76 u[IU]/mL (ref 0.35–4.50)

## 2016-11-04 LAB — PSA: PSA: 3.01 ng/mL (ref 0.10–4.00)

## 2016-11-04 MED ORDER — IRBESARTAN-HYDROCHLOROTHIAZIDE 150-12.5 MG PO TABS: 1.0000 | ORAL_TABLET | Freq: Every day | ORAL | 0 refills | Status: DC

## 2016-11-04 MED ORDER — LITHIUM CARBONATE ER 300 MG PO TBCR: 300.0000 mg | EXTENDED_RELEASE_TABLET | Freq: Two times a day (BID) | ORAL | 0 refills | Status: DC

## 2016-11-04 MED ORDER — DULOXETINE HCL 60 MG PO CPEP: 60.0000 mg | ORAL_CAPSULE | Freq: Two times a day (BID) | ORAL | 6 refills | Status: DC

## 2016-11-04 NOTE — Patient Instructions (Signed)
Do not take your lithium the morning of your next appointment.

## 2016-11-04 NOTE — Progress Notes (Signed)
Pre visit review using our clinic review tool, if applicable. No additional management support is needed unless otherwise documented below in the visit note. 

## 2016-11-05 ENCOUNTER — Other Ambulatory Visit: Payer: Self-pay | Admitting: Family Medicine

## 2016-11-05 ENCOUNTER — Other Ambulatory Visit: Payer: BLUE CROSS/BLUE SHIELD

## 2016-11-05 DIAGNOSIS — D72829 Elevated white blood cell count, unspecified: Secondary | ICD-10-CM

## 2016-11-08 ENCOUNTER — Encounter: Payer: Self-pay | Admitting: Family Medicine

## 2016-11-08 LAB — PATHOLOGIST SMEAR REVIEW

## 2016-11-18 ENCOUNTER — Ambulatory Visit (INDEPENDENT_AMBULATORY_CARE_PROVIDER_SITE_OTHER): Payer: BLUE CROSS/BLUE SHIELD | Admitting: Family Medicine

## 2016-11-18 ENCOUNTER — Encounter: Payer: Self-pay | Admitting: Family Medicine

## 2016-11-18 ENCOUNTER — Other Ambulatory Visit: Payer: Self-pay | Admitting: Family Medicine

## 2016-11-18 VITALS — BP 119/81 | HR 79 | Temp 98.2°F | Resp 16 | Ht 68.75 in | Wt 162.0 lb

## 2016-11-18 DIAGNOSIS — F339 Major depressive disorder, recurrent, unspecified: Secondary | ICD-10-CM

## 2016-11-18 DIAGNOSIS — Z125 Encounter for screening for malignant neoplasm of prostate: Secondary | ICD-10-CM | POA: Diagnosis not present

## 2016-11-18 DIAGNOSIS — Z0000 Encounter for general adult medical examination without abnormal findings: Secondary | ICD-10-CM | POA: Diagnosis not present

## 2016-11-18 DIAGNOSIS — Z1211 Encounter for screening for malignant neoplasm of colon: Secondary | ICD-10-CM

## 2016-11-18 DIAGNOSIS — I10 Essential (primary) hypertension: Secondary | ICD-10-CM

## 2016-11-18 DIAGNOSIS — Z79899 Other long term (current) drug therapy: Secondary | ICD-10-CM

## 2016-11-18 DIAGNOSIS — E7800 Pure hypercholesterolemia, unspecified: Secondary | ICD-10-CM

## 2016-11-18 DIAGNOSIS — D72829 Elevated white blood cell count, unspecified: Secondary | ICD-10-CM

## 2016-11-18 LAB — COMPREHENSIVE METABOLIC PANEL
ALBUMIN: 4.4 g/dL (ref 3.5–5.2)
ALK PHOS: 64 U/L (ref 39–117)
ALT: 23 U/L (ref 0–53)
AST: 20 U/L (ref 0–37)
BUN: 13 mg/dL (ref 6–23)
CALCIUM: 9.7 mg/dL (ref 8.4–10.5)
CHLORIDE: 100 meq/L (ref 96–112)
CO2: 31 mEq/L (ref 19–32)
Creatinine, Ser: 1.24 mg/dL (ref 0.40–1.50)
GFR: 64.56 mL/min (ref >60.00)
Glucose, Bld: 93 mg/dL (ref 70–99)
POTASSIUM: 4.3 meq/L (ref 3.5–5.1)
Sodium: 136 mEq/L (ref 135–145)
TOTAL PROTEIN: 7 g/dL (ref 6.0–8.3)
Total Bilirubin: 0.5 mg/dL (ref 0.2–1.2)

## 2016-11-18 MED ORDER — ATORVASTATIN CALCIUM 40 MG PO TABS: 40.0000 mg | ORAL_TABLET | Freq: Every day | ORAL | 2 refills | Status: DC

## 2016-11-18 NOTE — Progress Notes (Signed)
Office Note 11/18/2016  CC:  Chief Complaint  Patient presents with  . Annual Exam   HPI:  Wayne Douglas is a 56 y.o. male who is here for annual health maintenance exam plus f/u recently dx'd HTN as well as f/u treatment resistant depression (for which we started him on lithium 2 wks ago).  Labs done last visit showed mild leukocytosis.  Plan is to repeat this in 2 mo with path smear review. Also, cholesterol signif elevated, recommended statin but we were not able to get in phone contact with him to discuss.  Discussed high chol today.  He says he eats a healthy diet.  Depression: last dose of lithium was about 5 hours ago.  He feels like he is better by about 25%. Feels like he has had to drink a lot of water since getting on the med.  HTN: no home monitoring of bp since last visit.  Taking irbesartan/hctz 150/12.5 qd, no side effects.  Past Medical History:  Diagnosis Date  . Allergic rhinitis   . Anxiety and depression 04/15/2014   [facility] admissions in 2000 and 2002 per review of EMR records (details not available).  . Chronic renal insufficiency, stage 2 (mild)    GFR 60 ml/min  . Hyperlipidemia    statin recommended 10/2016  . Hypertension   . Leukocytosis 10/2016   repeating 2 mo, with path smear review.    Past Surgical History:  Procedure Laterality Date  . APPENDECTOMY  1974  . FINGER SURGERY  remote past   tendon repair right index     Family History  Problem Relation Age of Onset  . Melanoma Mother   . Heart attack Father   . Heart disease Father   . Diabetes Son     Social History   Social History  . Marital status: Married    Spouse name: N/A  . Number of children: N/A  . Years of education: N/A   Occupational History  . Not on file.   Social History Main Topics  . Smoking status: Never Smoker  . Smokeless tobacco: Never Used  . Alcohol use 2.4 oz/week    4 Cans of beer per week  . Drug use: No  . Sexual activity: Yes    Partners:  Female   Other Topics Concern  . Not on file   Social History Narrative   Married, 4 children.   Educ: college.   Occup: Music therapistcarpenter.   Alc: 4-5 beers per week.   No tob.    Outpatient Medications Prior to Visit  Medication Sig Dispense Refill  . DULoxetine (CYMBALTA) 60 MG capsule Take 1 capsule (60 mg total) by mouth 2 (two) times daily. 60 capsule 6  . irbesartan-hydrochlorothiazide (AVALIDE) 150-12.5 MG tablet Take 1 tablet by mouth daily. 30 tablet 0  . lithium carbonate (LITHOBID) 300 MG CR tablet Take 1 tablet (300 mg total) by mouth 2 (two) times daily. 60 tablet 0   No facility-administered medications prior to visit.     No Known Allergies  ROS Review of Systems  Constitutional: Negative for appetite change, chills, fatigue and fever.  HENT: Negative for congestion, dental problem, ear pain and sore throat.   Eyes: Negative for discharge, redness and visual disturbance.  Respiratory: Negative for cough, chest tightness, shortness of breath and wheezing.   Cardiovascular: Negative for chest pain, palpitations and leg swelling.  Gastrointestinal: Negative for abdominal pain, blood in stool, diarrhea, nausea and vomiting.  Genitourinary: Negative for difficulty  urinating, dysuria, flank pain, frequency, hematuria and urgency.  Musculoskeletal: Negative for arthralgias, back pain, joint swelling, myalgias and neck stiffness.  Skin: Negative for pallor and rash.  Neurological: Negative for dizziness, speech difficulty, weakness and headaches.  Hematological: Negative for adenopathy. Does not bruise/bleed easily.  Psychiatric/Behavioral: Negative for confusion and sleep disturbance. The patient is not nervous/anxious.     PE; Blood pressure 119/81, pulse 79, temperature 98.2 F (36.8 C), temperature source Oral, resp. rate 16, height 5' 8.75" (1.746 m), weight 162 lb (73.5 kg), SpO2 99 %. Gen: Alert, well appearing.  Patient is oriented to person, place, time, and  situation. AFFECT: pleasant, lucid thought and speech. ENT: Ears: EACs clear, normal epithelium.  TMs with good light reflex and landmarks bilaterally.  Eyes: no injection, icteris, swelling, or exudate.  EOMI, PERRLA. Nose: no drainage or turbinate edema/swelling.  No injection or focal lesion.  Mouth: lips without lesion/swelling.  Oral mucosa pink and moist.  Dentition intact and without obvious caries or gingival swelling.  Oropharynx without erythema, exudate, or swelling.  Neck: supple/nontender.  No LAD, mass, or TM.  Carotid pulses 2+ bilaterally, without bruits. CV: RRR, no m/r/g.   LUNGS: CTA bilat, nonlabored resps, good aeration in all lung fields. ABD: soft, NT, ND, BS normal.  No hepatospenomegaly or mass.  No bruits. EXT: no clubbing, cyanosis, or edema.  Musculoskeletal: no joint swelling, erythema, warmth, or tenderness.  ROM of all joints intact. Skin - no sores or suspicious lesions or rashes or color changes Rectal exam: negative without mass, lesions or tenderness, PROSTATE EXAM: smooth and symmetric without nodules or tenderness.  Pertinent labs:  Lab Results  Component Value Date   TSH 1.76 11/04/2016   Lab Results  Component Value Date   WBC 14.7 (H) 11/04/2016   HGB 16.3 11/04/2016   HCT 48.8 11/04/2016   MCV 96.6 11/04/2016   PLT 376.0 11/04/2016   Lab Results  Component Value Date   CREATININE 1.06 11/04/2016   BUN 17 11/04/2016   NA 139 11/04/2016   K 5.0 11/04/2016   CL 103 11/04/2016   CO2 29 11/04/2016   Lab Results  Component Value Date   ALT 15 11/04/2016   AST 17 11/04/2016   ALKPHOS 56 11/04/2016   BILITOT 0.5 11/04/2016   Lab Results  Component Value Date   CHOL 245 (H) 11/04/2016   Lab Results  Component Value Date   HDL 53.30 11/04/2016   Lab Results  Component Value Date   LDLCALC 164 (H) 11/04/2016   Lab Results  Component Value Date   TRIG 137.0 11/04/2016   Lab Results  Component Value Date   CHOLHDL 5 11/04/2016    Lab Results  Component Value Date   PSA 3.01 11/04/2016   PSA 1.58 07/27/2006   ASSESSMENT AND PLAN:   1) HTN; The current medical regimen is effective;  continue present plan and medications. Check lytes/cr today.  2) Hyperlipidemia: start atorvastatin 40mg  qd today.  Therapeutic expectations and side effect profile of medication discussed today.  Patient's questions answered. Plan on recheck FLP in 2-3 mo.  3) Recurrent major depression, treatment resistant:  Somewhat improved with addition of lithium 300mg  bid x the last 2 wks.  Check lithium level today (5 hours since last dose) as well as LFTs and lytes.  4) Mild leukocytosis (neutrophilia) 2 wks ago: plan to repeat CBC and check path smear review when he returns in 2 mo.  5) Health maintenance exam: Reviewed age  and gender appropriate health maintenance issues (prudent diet, regular exercise, health risks of tobacco and excessive alcohol, use of seatbelts, fire alarms in home, use of sunscreen).  Also reviewed age and gender appropriate health screening as well as vaccine recommendations. He declines flu vaccine.   Reviewed all HP labs done at visit 2 wks ago. Prostate ca screening: DRE today normal.  PSA 2 wks ago normal. Colon cancer screening: he has not had his initial screening colonoscopy yet.  Referred to GI today.  An After Visit Summary was printed and given to the patient.  FOLLOW UP:  Return in about 2 months (around 01/18/2017) for routine chronic illness f/u.  Signed:  Santiago Bumpers, MD           11/18/2016

## 2016-11-18 NOTE — Progress Notes (Signed)
Pre visit review using our clinic review tool, if applicable. No additional management support is needed unless otherwise documented below in the visit note. 

## 2016-11-18 NOTE — Patient Instructions (Signed)
 Health Maintenance, Male A healthy lifestyle and preventive care is important for your health and wellness. Ask your health care provider about what schedule of regular examinations is right for you. What should I know about weight and diet?  Eat a Healthy Diet  Eat plenty of vegetables, fruits, whole grains, low-fat dairy products, and lean protein.  Do not eat a lot of foods high in solid fats, added sugars, or salt. Maintain a Healthy Weight  Regular exercise can help you achieve or maintain a healthy weight. You should:  Do at least 150 minutes of exercise each week. The exercise should increase your heart rate and make you sweat (moderate-intensity exercise).  Do strength-training exercises at least twice a week. Watch Your Levels of Cholesterol and Blood Lipids  Have your blood tested for lipids and cholesterol every 5 years starting at 54 years of age. If you are at high risk for heart disease, you should start having your blood tested when you are 54 years old. You may need to have your cholesterol levels checked more often if:  Your lipid or cholesterol levels are high.  You are older than 54 years of age.  You are at high risk for heart disease. What should I know about cancer screening? Many types of cancers can be detected early and may often be prevented. Lung Cancer  You should be screened every year for lung cancer if:  You are a current smoker who has smoked for at least 30 years.  You are a former smoker who has quit within the past 15 years.  Talk to your health care provider about your screening options, when you should start screening, and how often you should be screened. Colorectal Cancer  Routine colorectal cancer screening usually begins at 54 years of age and should be repeated every 5-10 years until you are 54 years old. You may need to be screened more often if early forms of precancerous polyps or small growths are found. Your health care provider  may recommend screening at an earlier age if you have risk factors for colon cancer.  Your health care provider may recommend using home test kits to check for hidden blood in the stool.  A small camera at the end of a tube can be used to examine your colon (sigmoidoscopy or colonoscopy). This checks for the earliest forms of colorectal cancer. Prostate and Testicular Cancer  Depending on your age and overall health, your health care provider may do certain tests to screen for prostate and testicular cancer.  Talk to your health care provider about any symptoms or concerns you have about testicular or prostate cancer. Skin Cancer  Check your skin from head to toe regularly.  Tell your health care provider about any new moles or changes in moles, especially if:  There is a change in a mole's size, shape, or color.  You have a mole that is larger than a pencil eraser.  Always use sunscreen. Apply sunscreen liberally and repeat throughout the day.  Protect yourself by wearing long sleeves, pants, a wide-brimmed hat, and sunglasses when outside. What should I know about heart disease, diabetes, and high blood pressure?  If you are 18-39 years of age, have your blood pressure checked every 3-5 years. If you are 40 years of age or older, have your blood pressure checked every year. You should have your blood pressure measured twice-once when you are at a hospital or clinic, and once when you are not at   a hospital or clinic. Record the average of the two measurements. To check your blood pressure when you are not at a hospital or clinic, you can use:  An automated blood pressure machine at a pharmacy.  A home blood pressure monitor.  Talk to your health care provider about your target blood pressure.  If you are between 45-79 years old, ask your health care provider if you should take aspirin to prevent heart disease.  Have regular diabetes screenings by checking your fasting blood sugar  level.  If you are at a normal weight and have a low risk for diabetes, have this test once every three years after the age of 45.  If you are overweight and have a high risk for diabetes, consider being tested at a younger age or more often.  A one-time screening for abdominal aortic aneurysm (AAA) by ultrasound is recommended for men aged 65-75 years who are current or former smokers. What should I know about preventing infection? Hepatitis B  If you have a higher risk for hepatitis B, you should be screened for this virus. Talk with your health care provider to find out if you are at risk for hepatitis B infection. Hepatitis C  Blood testing is recommended for:  Everyone born from 1945 through 1965.  Anyone with known risk factors for hepatitis C. Sexually Transmitted Diseases (STDs)  You should be screened each year for STDs including gonorrhea and chlamydia if:  You are sexually active and are younger than 54 years of age.  You are older than 54 years of age and your health care provider tells you that you are at risk for this type of infection.  Your sexual activity has changed since you were last screened and you are at an increased risk for chlamydia or gonorrhea. Ask your health care provider if you are at risk.  Talk with your health care provider about whether you are at high risk of being infected with HIV. Your health care provider may recommend a prescription medicine to help prevent HIV infection. What else can I do?  Schedule regular health, dental, and eye exams.  Stay current with your vaccines (immunizations).  Do not use any tobacco products, such as cigarettes, chewing tobacco, and e-cigarettes. If you need help quitting, ask your health care provider.  Limit alcohol intake to no more than 2 drinks per day. One drink equals 12 ounces of beer, 5 ounces of wine, or 1 ounces of hard liquor.  Do not use street drugs.  Do not share needles.  Ask your health  care provider for help if you need support or information about quitting drugs.  Tell your health care provider if you often feel depressed.  Tell your health care provider if you have ever been abused or do not feel safe at home. This information is not intended to replace advice given to you by your health care provider. Make sure you discuss any questions you have with your health care provider. Document Released: 02/05/2008 Document Revised: 04/07/2016 Document Reviewed: 05/13/2015 Elsevier Interactive Patient Education  2017 Elsevier Inc.  

## 2016-11-19 LAB — LITHIUM LEVEL: LITHIUM LVL: 0.4 mmol/L — AB (ref 0.6–1.2)

## 2016-12-01 ENCOUNTER — Encounter: Payer: Self-pay | Admitting: *Deleted

## 2016-12-05 ENCOUNTER — Other Ambulatory Visit: Payer: Self-pay | Admitting: Family Medicine

## 2016-12-06 ENCOUNTER — Other Ambulatory Visit: Payer: Self-pay | Admitting: *Deleted

## 2016-12-06 ENCOUNTER — Telehealth: Payer: Self-pay

## 2016-12-06 MED ORDER — IRBESARTAN-HYDROCHLOROTHIAZIDE 150-12.5 MG PO TABS: 1.0000 | ORAL_TABLET | Freq: Every day | ORAL | 6 refills | Status: AC

## 2016-12-06 MED ORDER — LITHIUM CARBONATE ER 300 MG PO TBCR: 300.0000 mg | EXTENDED_RELEASE_TABLET | Freq: Two times a day (BID) | ORAL | 1 refills | Status: DC

## 2016-12-06 MED ORDER — LITHIUM CARBONATE ER 450 MG PO TBCR: 450.0000 mg | EXTENDED_RELEASE_TABLET | Freq: Two times a day (BID) | ORAL | 1 refills | Status: DC

## 2016-12-06 NOTE — Telephone Encounter (Signed)
Patient called concerning letter he received in the mail.  He stated that it was fine to increase Lithium medication and to go ahead and call the medication in to pharmacy.

## 2016-12-06 NOTE — Telephone Encounter (Signed)
Lithium RF'd. Needs o/v in 6-8 weeks.-thx

## 2016-12-06 NOTE — Telephone Encounter (Signed)
[facility].  RF request for irbesartan/hctz -------this Rx has been sent LOV: 11/18/16 Next ov: 01/18/17 Last written: 11/04/16 #30 w/ 1OX  RF request for lithium Last written: 11/04/16 #30 w/ 0RF  Please advise. Thanks.

## 2016-12-06 NOTE — Telephone Encounter (Signed)
John with [facility], calling to get clarification on script they just received for lithium carbonate (ESKALITH) 450 MG CR tablet.  1.  They received a script for lithium 300 on yesterday.  Is the script sent today for lithium 200 replacing the one sent yesterday?  2.  Patient is also taking irbesartan-hydrochlorothiazide (AVALIDE) 150-12.5 MG tablet.  There is a level 2 interaction between these medications.  Needs authorization from pcp to fill both meds.  Please call back at [phone].

## 2016-12-06 NOTE — Telephone Encounter (Signed)
OK, but I just authorized RF of the lithium  bid at [facility] this morning.  Can you call and cancel that rx and I'll go ahead and send in rx for lithium .-thx

## 2016-12-06 NOTE — Telephone Encounter (Signed)
SW Dr. Milinda Cave his is aware of the interactions between lithium and irbesartan/hctz, okay to fill both medications.  [facility] and advised him its okay to fill both medications per Dr. Milinda Cave. John was also advised to d/c  lithium and fill  lithium.

## 2016-12-09 ENCOUNTER — Encounter: Payer: Self-pay | Admitting: Family Medicine

## 2016-12-22 ENCOUNTER — Encounter: Payer: Self-pay | Admitting: Family Medicine

## 2017-01-10 ENCOUNTER — Telehealth: Payer: Self-pay

## 2017-01-10 NOTE — Telephone Encounter (Signed)
Psychiatrist or psychologist?

## 2017-01-10 NOTE — Telephone Encounter (Signed)
Please advise. Thanks.  

## 2017-01-10 NOTE — Telephone Encounter (Signed)
Patient requesting referral to psych. Please return patient's phone call.

## 2017-01-10 NOTE — Telephone Encounter (Signed)
SW pt and he stated that he would like to be referred to a psychiatrist.

## 2017-01-11 NOTE — Telephone Encounter (Signed)
Pt called upset that he has not heard back from us in regards to this referral. I advised pt that I did speak to him yesterday to see if he wanted to go to psychiatry or psychology and that I sent a message back to Dr. Milinda CaveMcGowen who has not had a change to review this message. I advised pt that we would contact him once we have placed the referral and give him any information he may need to schedule an appointment. Pt then stated that he did not want to wait months for an appointment and be taking medications that are only going to be making him feel worse. He also stated that he feels like maybe he is suppose to feel depressed. I asked pt if he would like to schedule an appointment to see Dr. Milinda CaveMcGowen. He stated that he would like schedule an appointment to come in and talk about this. Appointment made for tomorrow (01/12/17) at 8:30am. (Pt requested morning apt).

## 2017-01-11 NOTE — Telephone Encounter (Signed)
Noted.  This is fine.  Will hold off on ordering referral at this time.

## 2017-01-12 ENCOUNTER — Ambulatory Visit (INDEPENDENT_AMBULATORY_CARE_PROVIDER_SITE_OTHER): Payer: BLUE CROSS/BLUE SHIELD | Admitting: Family Medicine

## 2017-01-12 ENCOUNTER — Encounter: Payer: Self-pay | Admitting: Family Medicine

## 2017-01-12 VITALS — BP 139/95 | HR 89 | Temp 98.5°F | Resp 16 | Wt 148.0 lb

## 2017-01-12 DIAGNOSIS — F332 Major depressive disorder, recurrent severe without psychotic features: Secondary | ICD-10-CM

## 2017-01-12 MED ORDER — LITHIUM CARBONATE ER 450 MG PO TBCR: 450.0000 mg | EXTENDED_RELEASE_TABLET | Freq: Two times a day (BID) | ORAL | 0 refills | Status: DC

## 2017-01-12 MED ORDER — BUPROPION HCL ER (XL) 150 MG PO TB24: 150.0000 mg | ORAL_TABLET | Freq: Every day | ORAL | 0 refills | Status: DC

## 2017-01-12 MED ORDER — DULOXETINE HCL 60 MG PO CPEP: 60.0000 mg | ORAL_CAPSULE | Freq: Every day | ORAL | 6 refills | Status: DC

## 2017-01-12 NOTE — Progress Notes (Signed)
OFFICE VISIT  01/12/2017   CC:  Chief Complaint  Patient presents with  . Depression    discuss medications and referral   HPI:    Patient is a 56 y.o.  male who presents for f/u depression. I last saw him about 2 mo ago.  At that time a lithium level was fine so I increased his dose to 450 mg bid--he has been on this dosing for about 5 weeks. Two days ago he called and requested a referral to psychiatrist.  I had not made the referral by the next day so he called a little upset, wondering why it was taking so long. After conversation with my nurse, it was decided he should come see me today and we'll proceed as necessary regarding med adjustment and/or referral.  Says he "hates every moment of every day".   Feels absolutely miserable. Very depressed, isolates, started smoking again, drinks alcohol to self medicate.  Feels like he can't talk anyone.  Feeling overwhelmed with life.  His family is fed up. He denies SI, HI. Denies that he is any worse on lithium since he got on this med about 2 mo ago.  Past Medical History:  Diagnosis Date  . Allergic rhinitis   . Anxiety and depression 04/15/2014   BH admissions in 2000 and 2002 per review of EMR records (details not available).  . Chronic renal insufficiency, stage 2 (mild)    GFR 60 ml/min  . Hyperlipidemia    statin recommended 10/2016  . Hypertension   . Leukocytosis 10/2016   repeating 2 mo, with path smear review.    Past Surgical History:  Procedure Laterality Date  . APPENDECTOMY  1974  . FINGER SURGERY  remote past   tendon repair right index     Outpatient Medications Prior to Visit  Medication Sig Dispense Refill  . atorvastatin (LIPITOR) 40 MG tablet Take 1 tablet (40 mg total) by mouth daily. 30 tablet 2  . irbesartan-hydrochlorothiazide (AVALIDE) 150-12.5 MG tablet Take 1 tablet by mouth daily. 30 tablet 6  . DULoxetine (CYMBALTA) 60 MG capsule Take 1 capsule (60 mg total) by mouth 2 (two) times daily. 60  capsule 6  . lithium carbonate (ESKALITH) 450 MG CR tablet Take 1 tablet (450 mg total) by mouth 2 (two) times daily. 60 tablet 1   No facility-administered medications prior to visit.     No Known Allergies  ROS As per HPI  PE: Blood pressure (!) 139/95, pulse 89, temperature 98.5 F (36.9 C), temperature source Oral, resp. rate 16, weight 148 lb (67.1 kg), SpO2 99 %.  Gen: Alert, well appearing.  Patient is oriented to person, place, time, and situation. Affect: occ pleasant but mostly angry, occasionally cussing in frustration with the way he feels and the helplessness he feels. He is lucid in thought and speech. No further exam today.  LABS:    Chemistry      Component Value Date/Time   NA 136 11/18/2016 1328   K 4.3 11/18/2016 1328   CL 100 11/18/2016 1328   CO2 31 11/18/2016 1328   BUN 13 11/18/2016 1328   CREATININE 1.24 11/18/2016 1328      Component Value Date/Time   CALCIUM 9.7 11/18/2016 1328   ALKPHOS 64 11/18/2016 1328   AST 20 11/18/2016 1328   ALT 23 11/18/2016 1328   BILITOT 0.5 11/18/2016 1328    GFR 11/18/16= 60s.  Lab Results  Component Value Date   LITHIUM 0.4 (L) 11/18/2016  NA 136 11/18/2016   BUN 13 11/18/2016   CREATININE 1.24 11/18/2016   TSH 1.76 11/04/2016   WBC 14.7 (H) 11/04/2016   Lab Results  Component Value Date   WBC 14.7 (H) 11/04/2016   HGB 16.3 11/04/2016   HCT 48.8 11/04/2016   MCV 96.6 11/04/2016   PLT 376.0 11/04/2016   IMPRESSION AND PLAN:  Major depressive disorder, severe, recurrent/persistent: discussed options today. I recommended he present to [facility] or another psych inpatient facility and get evaluated for possible inpatient treatment.  He said absolutely no to this proposal. I then recommended we try adding a med to his regimen and we'll get him referred to a psychiatrist (private--preferred by pt) ASAP. Decreased cymbalta to 60mg  ONCE daily. Added wellbutrin XL 150mg  qd. Continue lithium ER 450mg  bid.  An  After Visit Summary was printed and given to the patient.  FOLLOW UP: Return in about 3 weeks (around 02/02/2017) for f/u mood.--he may cancel this appointment IF he gets in with a psychiatrist prior to this time.  Signed:  Santiago Bumpers, MD           01/12/2017

## 2017-01-18 ENCOUNTER — Ambulatory Visit: Payer: BLUE CROSS/BLUE SHIELD | Admitting: Family Medicine

## 2017-01-25 ENCOUNTER — Telehealth: Payer: Self-pay | Admitting: *Deleted

## 2017-01-25 NOTE — Telephone Encounter (Signed)
Pt LMOM on 01/25/17 at 1:39pm asking about referral to psychiatry.   I SW Diane who is going to contact pt and [facility]. Per referral note they did try to contact pt but was unable to leave a message.

## 2017-01-26 NOTE — Telephone Encounter (Signed)
Called patient & [facility]

## 2017-02-02 ENCOUNTER — Ambulatory Visit: Payer: BLUE CROSS/BLUE SHIELD | Admitting: Family Medicine
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
## 2017-02-17 ENCOUNTER — Other Ambulatory Visit: Payer: Self-pay | Admitting: Family Medicine

## 2017-02-18 NOTE — Telephone Encounter (Signed)
I'll do rx for 1 mo supply with 1 RF. Needs to f/u with me in office in about 1-2 mo IF he has not already been set up with a psychiatrist in the next 2 mo.-thx

## 2017-02-18 NOTE — Telephone Encounter (Signed)
Pt advised and voiced understanding. He stated that he has an apt with a psychiatrist on 03/04/17.

## 2017-02-18 NOTE — Telephone Encounter (Signed)
[facility]  RF request for bupropion LOV: 01/12/17 Next ov: None Last written: 01/12/17 #30 w/ 0RF  Please advise. Thanks.

## 2017-02-20 ENCOUNTER — Other Ambulatory Visit: Payer: Self-pay | Admitting: Family Medicine

## 2017-03-04 ENCOUNTER — Encounter (HOSPITAL_COMMUNITY): Payer: Self-pay | Admitting: Psychiatry

## 2017-03-04 ENCOUNTER — Ambulatory Visit (INDEPENDENT_AMBULATORY_CARE_PROVIDER_SITE_OTHER): Payer: BLUE CROSS/BLUE SHIELD | Admitting: Psychiatry

## 2017-03-04 DIAGNOSIS — F4323 Adjustment disorder with mixed anxiety and depressed mood: Secondary | ICD-10-CM | POA: Diagnosis not present

## 2017-03-04 DIAGNOSIS — F1099 Alcohol use, unspecified with unspecified alcohol-induced disorder: Secondary | ICD-10-CM

## 2017-03-04 DIAGNOSIS — F0630 Mood disorder due to known physiological condition, unspecified: Secondary | ICD-10-CM

## 2017-03-04 MED ORDER — LAMOTRIGINE 25 MG PO TABS: 25.0000 mg | ORAL_TABLET | Freq: Every day | ORAL | 0 refills | Status: DC

## 2017-03-04 NOTE — Patient Instructions (Signed)
Consider therapy.
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
## 2017-03-31 ENCOUNTER — Other Ambulatory Visit (HOSPITAL_COMMUNITY): Payer: Self-pay | Admitting: Psychiatry

## 2017-04-04 ENCOUNTER — Encounter (HOSPITAL_COMMUNITY): Payer: Self-pay | Admitting: Psychiatry

## 2017-04-04 ENCOUNTER — Other Ambulatory Visit: Payer: Self-pay | Admitting: *Deleted

## 2017-04-04 ENCOUNTER — Other Ambulatory Visit (HOSPITAL_COMMUNITY): Payer: Self-pay | Admitting: Psychiatry

## 2017-04-04 ENCOUNTER — Ambulatory Visit (INDEPENDENT_AMBULATORY_CARE_PROVIDER_SITE_OTHER): Payer: BLUE CROSS/BLUE SHIELD | Admitting: Psychiatry

## 2017-04-04 VITALS — BP 128/78 | HR 94 | Resp 16 | Ht 69.0 in | Wt 151.0 lb

## 2017-04-04 DIAGNOSIS — F1099 Alcohol use, unspecified with unspecified alcohol-induced disorder: Secondary | ICD-10-CM

## 2017-04-04 DIAGNOSIS — F4323 Adjustment disorder with mixed anxiety and depressed mood: Secondary | ICD-10-CM | POA: Diagnosis not present

## 2017-04-04 DIAGNOSIS — Z5689 Other problems related to employment: Secondary | ICD-10-CM

## 2017-04-04 DIAGNOSIS — F0630 Mood disorder due to known physiological condition, unspecified: Secondary | ICD-10-CM

## 2017-04-04 DIAGNOSIS — Z599 Problem related to housing and economic circumstances, unspecified: Secondary | ICD-10-CM

## 2017-04-04 MED ORDER — LAMOTRIGINE 100 MG PO TABS: 100.0000 mg | ORAL_TABLET | Freq: Every day | ORAL | 0 refills | Status: DC
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
## 2017-04-04 NOTE — Telephone Encounter (Signed)
[facility]  RF request for lamotrigine LOV: 01/12/17 Next ov: None Last written: 03/04/17 #60 w/ 0RF  Please advise. Thanks.

## 2017-04-04 NOTE — Telephone Encounter (Signed)
Per Dr. Milinda CaveMcGowen please take over refilling/managaning pts medication. Thanks.

## 2017-04-11 ENCOUNTER — Ambulatory Visit (HOSPITAL_COMMUNITY): Payer: Self-pay | Admitting: Licensed Clinical Social Worker

## 2017-04-27 ENCOUNTER — Other Ambulatory Visit: Payer: Self-pay | Admitting: Family Medicine

## 2017-04-27 ENCOUNTER — Other Ambulatory Visit (HOSPITAL_COMMUNITY): Payer: Self-pay | Admitting: *Deleted

## 2017-04-27 NOTE — Telephone Encounter (Signed)
Medication management- [facility] Well[facility]rin. Rx's were last filled on 03/25/17 by Dr. Horatio PelPhilip McGowan. Pt was seen on 03/04/17, per Dr. Gilmore LarocheAkhtar, lithium rx was discontinue. Please advise.

## 2017-04-28 NOTE — Telephone Encounter (Signed)
Lithium he stopped. Wellbutrin was not stopped. Can get refill or call if he has stopped that

## 2017-04-28 NOTE — Telephone Encounter (Signed)
Medication refill- received refill request for Wellbutrin and Lithium. Per Dr. Gilmore LarocheAkhtar, refill is authorize for Wellbutrin 150mg , #30. Rx was sent to pharmacy.  Lithium Rx was denied. Lithium rx was discontinue on 03/04/17. Pt's next apt is schedule on 05/24/17. Lvm informing pt of refill status. Nothing further is need at this time.

## 2017-05-05 ENCOUNTER — Other Ambulatory Visit (HOSPITAL_COMMUNITY): Payer: Self-pay | Admitting: Psychiatry

## 2017-05-05 NOTE — Telephone Encounter (Signed)
Medication refill- [facility] a [facility]ill for Lamictal. Last refill was sent to pharmacy on 04/04/17. Per Dr. Gilmore LarocheAkhtar, refill is authorize for Lamictal 100mg , #30. Rx was sent to pharmacy. Pt's next apt is schedule on 05/24/17. Lvm informing pt of refill status. Nothing further is need at this time.

## 2017-05-24 ENCOUNTER — Ambulatory Visit (INDEPENDENT_AMBULATORY_CARE_PROVIDER_SITE_OTHER): Payer: BLUE CROSS/BLUE SHIELD | Admitting: Psychiatry

## 2017-05-24 ENCOUNTER — Encounter (HOSPITAL_COMMUNITY): Payer: Self-pay | Admitting: Psychiatry

## 2017-05-24 VITALS — BP 126/80 | HR 103 | Resp 18 | Ht 69.0 in | Wt 152.0 lb

## 2017-05-24 DIAGNOSIS — F0630 Mood disorder due to known physiological condition, unspecified: Secondary | ICD-10-CM | POA: Diagnosis not present

## 2017-05-24 DIAGNOSIS — Z62810 Personal history of physical and sexual abuse in childhood: Secondary | ICD-10-CM

## 2017-05-24 DIAGNOSIS — F4323 Adjustment disorder with mixed anxiety and depressed mood: Secondary | ICD-10-CM | POA: Diagnosis not present

## 2017-05-24 DIAGNOSIS — F1099 Alcohol use, unspecified with unspecified alcohol-induced disorder: Secondary | ICD-10-CM | POA: Diagnosis not present

## 2017-05-24 DIAGNOSIS — Z560 Unemployment, unspecified: Secondary | ICD-10-CM

## 2017-05-24 DIAGNOSIS — Z79899 Other long term (current) drug therapy: Secondary | ICD-10-CM | POA: Diagnosis not present

## 2017-05-24 MED ORDER — LAMOTRIGINE 25 MG PO TABS: 50.0000 mg | ORAL_TABLET | Freq: Every day | ORAL | 0 refills | Status: AC

## 2017-05-24 MED ORDER — DULOXETINE HCL 60 MG PO CPEP: 60.0000 mg | ORAL_CAPSULE | Freq: Every day | ORAL | 0 refills | Status: AC

## 2017-05-24 MED ORDER — BUPROPION HCL ER (XL) 150 MG PO TB24: 150.0000 mg | ORAL_TABLET | Freq: Every day | ORAL | 0 refills | Status: AC
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
## 2017-05-31 ENCOUNTER — Other Ambulatory Visit: Payer: Self-pay | Admitting: Family Medicine

## 2017-06-23 ENCOUNTER — Ambulatory Visit (HOSPITAL_COMMUNITY): Payer: Self-pay | Admitting: Psychiatry

## 2017-07-04 ENCOUNTER — Other Ambulatory Visit (HOSPITAL_COMMUNITY): Payer: Self-pay | Admitting: Psychiatry

## 2021-12-04 ENCOUNTER — Other Ambulatory Visit: Payer: Self-pay
